# Patient Record
Sex: Female | Born: 1970 | Race: White | Hispanic: No | Marital: Married | Smoking: Never smoker
Health system: Southern US, Community
[De-identification: ages and names within clinical notes are randomized; demographics above are authoritative.]

## PROBLEM LIST (undated history)

## (undated) DIAGNOSIS — E039 Hypothyroidism, unspecified: Secondary | ICD-10-CM

## (undated) DIAGNOSIS — J309 Allergic rhinitis, unspecified: Secondary | ICD-10-CM

## (undated) DIAGNOSIS — M949 Disorder of cartilage, unspecified: Secondary | ICD-10-CM

## (undated) DIAGNOSIS — M899 Disorder of bone, unspecified: Secondary | ICD-10-CM

## (undated) DIAGNOSIS — C73 Malignant neoplasm of thyroid gland: Secondary | ICD-10-CM

## (undated) DIAGNOSIS — G4700 Insomnia, unspecified: Secondary | ICD-10-CM

## (undated) DIAGNOSIS — T8859XA Other complications of anesthesia, initial encounter: Secondary | ICD-10-CM

## (undated) DIAGNOSIS — F411 Generalized anxiety disorder: Secondary | ICD-10-CM

## (undated) DIAGNOSIS — H6010 Cellulitis of external ear, unspecified ear: Secondary | ICD-10-CM

## (undated) DIAGNOSIS — N6009 Solitary cyst of unspecified breast: Secondary | ICD-10-CM

## (undated) HISTORY — DX: Solitary cyst of unspecified breast: N60.09

## (undated) HISTORY — DX: Generalized anxiety disorder: F41.1

## (undated) HISTORY — DX: Disorder of bone, unspecified: M89.9

## (undated) HISTORY — DX: Hypothyroidism, unspecified: E03.9

## (undated) HISTORY — DX: Disorder of cartilage, unspecified: M94.9

## (undated) HISTORY — PX: BREAST SURGERY: SHX581

## (undated) HISTORY — DX: Allergic rhinitis, unspecified: J30.9

## (undated) HISTORY — DX: Insomnia, unspecified: G47.00

## (undated) HISTORY — DX: Cellulitis of external ear, unspecified ear: H60.10

## (undated) HISTORY — DX: Malignant neoplasm of thyroid gland: C73

---

## 1999-06-16 ENCOUNTER — Encounter: Payer: Self-pay | Admitting: Obstetrics and Gynecology

## 1999-06-16 ENCOUNTER — Ambulatory Visit (HOSPITAL_COMMUNITY): Admission: RE | Admit: 1999-06-16 | Discharge: 1999-06-16 | Payer: Self-pay | Admitting: Obstetrics and Gynecology

## 1999-06-29 ENCOUNTER — Ambulatory Visit (HOSPITAL_COMMUNITY): Admission: RE | Admit: 1999-06-29 | Discharge: 1999-06-29 | Payer: Self-pay | Admitting: Surgery

## 1999-06-29 ENCOUNTER — Encounter (INDEPENDENT_AMBULATORY_CARE_PROVIDER_SITE_OTHER): Payer: Self-pay

## 1999-06-29 ENCOUNTER — Encounter: Payer: Self-pay | Admitting: Surgery

## 1999-08-18 ENCOUNTER — Ambulatory Visit (HOSPITAL_BASED_OUTPATIENT_CLINIC_OR_DEPARTMENT_OTHER): Admission: RE | Admit: 1999-08-18 | Discharge: 1999-08-18 | Payer: Self-pay | Admitting: Surgery

## 1999-08-18 ENCOUNTER — Encounter (INDEPENDENT_AMBULATORY_CARE_PROVIDER_SITE_OTHER): Payer: Self-pay | Admitting: Specialist

## 2000-09-20 ENCOUNTER — Encounter: Admission: RE | Admit: 2000-09-20 | Discharge: 2000-09-20 | Payer: Self-pay | Admitting: Surgery

## 2000-09-20 ENCOUNTER — Encounter: Payer: Self-pay | Admitting: Surgery

## 2000-11-04 ENCOUNTER — Encounter: Payer: Self-pay | Admitting: Otolaryngology

## 2000-11-04 ENCOUNTER — Encounter: Admission: RE | Admit: 2000-11-04 | Discharge: 2000-11-04 | Payer: Self-pay | Admitting: Otolaryngology

## 2000-11-08 ENCOUNTER — Other Ambulatory Visit: Admission: RE | Admit: 2000-11-08 | Discharge: 2000-11-08 | Payer: Self-pay | Admitting: Otolaryngology

## 2000-11-19 ENCOUNTER — Other Ambulatory Visit: Admission: RE | Admit: 2000-11-19 | Discharge: 2000-11-19 | Payer: Self-pay | Admitting: Obstetrics and Gynecology

## 2001-01-30 ENCOUNTER — Ambulatory Visit (HOSPITAL_COMMUNITY): Admission: RE | Admit: 2001-01-30 | Discharge: 2001-01-31 | Payer: Self-pay | Admitting: Otolaryngology

## 2001-01-30 ENCOUNTER — Encounter (INDEPENDENT_AMBULATORY_CARE_PROVIDER_SITE_OTHER): Payer: Self-pay | Admitting: *Deleted

## 2001-11-25 ENCOUNTER — Other Ambulatory Visit: Admission: RE | Admit: 2001-11-25 | Discharge: 2001-11-25 | Payer: Self-pay | Admitting: Obstetrics and Gynecology

## 2002-02-17 ENCOUNTER — Encounter: Admission: RE | Admit: 2002-02-17 | Discharge: 2002-02-17 | Payer: Self-pay | Admitting: Obstetrics and Gynecology

## 2002-02-17 ENCOUNTER — Encounter: Payer: Self-pay | Admitting: Obstetrics and Gynecology

## 2002-06-04 DIAGNOSIS — C73 Malignant neoplasm of thyroid gland: Secondary | ICD-10-CM

## 2002-06-04 HISTORY — PX: THYROIDECTOMY: SHX17

## 2002-06-04 HISTORY — DX: Malignant neoplasm of thyroid gland: C73

## 2002-07-16 ENCOUNTER — Encounter (INDEPENDENT_AMBULATORY_CARE_PROVIDER_SITE_OTHER): Payer: Self-pay | Admitting: Specialist

## 2002-07-16 ENCOUNTER — Ambulatory Visit (HOSPITAL_COMMUNITY): Admission: RE | Admit: 2002-07-16 | Discharge: 2002-07-16 | Payer: Self-pay | Admitting: *Deleted

## 2002-09-17 ENCOUNTER — Ambulatory Visit (HOSPITAL_COMMUNITY): Admission: RE | Admit: 2002-09-17 | Discharge: 2002-09-17 | Payer: Self-pay | Admitting: Obstetrics and Gynecology

## 2002-09-17 ENCOUNTER — Encounter: Payer: Self-pay | Admitting: Obstetrics and Gynecology

## 2002-12-02 ENCOUNTER — Other Ambulatory Visit: Admission: RE | Admit: 2002-12-02 | Discharge: 2002-12-02 | Payer: Self-pay | Admitting: Obstetrics and Gynecology

## 2003-02-03 ENCOUNTER — Encounter: Admission: RE | Admit: 2003-02-03 | Discharge: 2003-02-03 | Payer: Self-pay | Admitting: Obstetrics and Gynecology

## 2003-02-03 ENCOUNTER — Encounter: Payer: Self-pay | Admitting: Obstetrics and Gynecology

## 2003-12-14 ENCOUNTER — Other Ambulatory Visit: Admission: RE | Admit: 2003-12-14 | Discharge: 2003-12-14 | Payer: Self-pay | Admitting: Obstetrics and Gynecology

## 2005-01-31 ENCOUNTER — Inpatient Hospital Stay (HOSPITAL_COMMUNITY): Admission: AD | Admit: 2005-01-31 | Discharge: 2005-02-03 | Payer: Self-pay | Admitting: Obstetrics and Gynecology

## 2005-02-27 ENCOUNTER — Encounter: Admission: RE | Admit: 2005-02-27 | Discharge: 2005-02-27 | Payer: Self-pay | Admitting: Obstetrics and Gynecology

## 2005-04-14 ENCOUNTER — Ambulatory Visit: Payer: Self-pay | Admitting: Family Medicine

## 2005-05-04 ENCOUNTER — Encounter: Admission: RE | Admit: 2005-05-04 | Discharge: 2005-05-04 | Payer: Self-pay | Admitting: Family Medicine

## 2005-05-04 ENCOUNTER — Ambulatory Visit: Payer: Self-pay | Admitting: Family Medicine

## 2005-05-29 ENCOUNTER — Ambulatory Visit: Payer: Self-pay | Admitting: Family Medicine

## 2005-07-23 ENCOUNTER — Encounter: Admission: RE | Admit: 2005-07-23 | Discharge: 2005-07-23 | Payer: Self-pay | Admitting: Obstetrics and Gynecology

## 2005-11-08 ENCOUNTER — Ambulatory Visit: Payer: Self-pay | Admitting: Family Medicine

## 2006-05-13 ENCOUNTER — Ambulatory Visit: Payer: Self-pay | Admitting: Family Medicine

## 2006-08-28 ENCOUNTER — Ambulatory Visit: Payer: Self-pay | Admitting: Internal Medicine

## 2006-09-18 ENCOUNTER — Ambulatory Visit: Payer: Self-pay | Admitting: Family Medicine

## 2006-12-30 DIAGNOSIS — T6391XA Toxic effect of contact with unspecified venomous animal, accidental (unintentional), initial encounter: Secondary | ICD-10-CM | POA: Insufficient documentation

## 2006-12-31 ENCOUNTER — Ambulatory Visit: Payer: Self-pay | Admitting: Family Medicine

## 2007-02-07 DIAGNOSIS — E039 Hypothyroidism, unspecified: Secondary | ICD-10-CM

## 2007-02-11 ENCOUNTER — Ambulatory Visit: Payer: Self-pay | Admitting: Family Medicine

## 2007-08-21 ENCOUNTER — Ambulatory Visit: Payer: Self-pay | Admitting: Family Medicine

## 2007-08-21 LAB — CONVERTED CEMR LAB
ALT: 21 units/L (ref 0–35)
AST: 23 units/L (ref 0–37)
Albumin: 3.9 g/dL (ref 3.5–5.2)
Alkaline Phosphatase: 43 units/L (ref 39–117)
BUN: 8 mg/dL (ref 6–23)
Basophils Absolute: 0 10*3/uL (ref 0.0–0.1)
Bilirubin, Direct: 0.1 mg/dL (ref 0.0–0.3)
Blood in Urine, dipstick: NEGATIVE
Calcium: 9.6 mg/dL (ref 8.4–10.5)
Chloride: 103 meq/L (ref 96–112)
Glucose, Urine, Semiquant: NEGATIVE
HCT: 41.8 % (ref 36.0–46.0)
Hemoglobin: 14.1 g/dL (ref 12.0–15.0)
Lymphocytes Relative: 23.5 % (ref 12.0–46.0)
MCHC: 33.7 g/dL (ref 30.0–36.0)
Monocytes Absolute: 0.1 10*3/uL — ABNORMAL LOW (ref 0.2–0.7)
Monocytes Relative: 2.1 % — ABNORMAL LOW (ref 3.0–11.0)
Neutro Abs: 5 10*3/uL (ref 1.4–7.7)
Neutrophils Relative %: 70.8 % (ref 43.0–77.0)
Potassium: 4.2 meq/L (ref 3.5–5.1)
Protein, U semiquant: NEGATIVE
RBC: 4.68 M/uL (ref 3.87–5.11)
RDW: 10.9 % — ABNORMAL LOW (ref 11.5–14.6)
Total CHOL/HDL Ratio: 3.6
Total Protein: 6.2 g/dL (ref 6.0–8.3)
Triglycerides: 145 mg/dL (ref 0–149)
Urobilinogen, UA: 0.2
WBC Urine, dipstick: NEGATIVE
pH: 5.5

## 2007-08-28 ENCOUNTER — Ambulatory Visit: Payer: Self-pay | Admitting: Family Medicine

## 2007-08-28 DIAGNOSIS — N6009 Solitary cyst of unspecified breast: Secondary | ICD-10-CM

## 2007-08-28 DIAGNOSIS — M8580 Other specified disorders of bone density and structure, unspecified site: Secondary | ICD-10-CM | POA: Insufficient documentation

## 2007-08-28 HISTORY — DX: Solitary cyst of unspecified breast: N60.09

## 2007-09-02 ENCOUNTER — Ambulatory Visit: Payer: Self-pay | Admitting: Internal Medicine

## 2007-09-02 ENCOUNTER — Encounter: Payer: Self-pay | Admitting: Family Medicine

## 2007-09-25 ENCOUNTER — Telehealth: Payer: Self-pay | Admitting: Family Medicine

## 2007-10-23 ENCOUNTER — Encounter: Admission: RE | Admit: 2007-10-23 | Discharge: 2007-10-23 | Payer: Self-pay | Admitting: Obstetrics and Gynecology

## 2007-11-14 ENCOUNTER — Ambulatory Visit: Payer: Self-pay | Admitting: Family Medicine

## 2007-11-14 LAB — CONVERTED CEMR LAB
Free T4: 0.56 ng/dL — ABNORMAL LOW (ref 0.89–1.80)
PTH: 11.1 pg/mL — ABNORMAL LOW (ref 14.0–72.0)

## 2007-11-15 ENCOUNTER — Telehealth: Payer: Self-pay | Admitting: Family Medicine

## 2007-11-17 ENCOUNTER — Telehealth: Payer: Self-pay | Admitting: *Deleted

## 2007-11-21 ENCOUNTER — Ambulatory Visit: Payer: Self-pay | Admitting: Family Medicine

## 2007-11-25 ENCOUNTER — Telehealth: Payer: Self-pay | Admitting: Family Medicine

## 2007-12-04 ENCOUNTER — Ambulatory Visit: Payer: Self-pay | Admitting: Family Medicine

## 2008-01-26 ENCOUNTER — Telehealth: Payer: Self-pay | Admitting: Family Medicine

## 2008-01-27 ENCOUNTER — Ambulatory Visit: Payer: Self-pay | Admitting: Internal Medicine

## 2008-01-27 DIAGNOSIS — T148XXA Other injury of unspecified body region, initial encounter: Secondary | ICD-10-CM | POA: Insufficient documentation

## 2008-04-17 ENCOUNTER — Ambulatory Visit: Payer: Self-pay | Admitting: Family Medicine

## 2008-04-28 ENCOUNTER — Telehealth: Payer: Self-pay | Admitting: Family Medicine

## 2008-07-21 ENCOUNTER — Encounter: Payer: Self-pay | Admitting: Family Medicine

## 2008-08-04 ENCOUNTER — Ambulatory Visit: Payer: Self-pay | Admitting: Family Medicine

## 2008-08-04 DIAGNOSIS — F4322 Adjustment disorder with anxiety: Secondary | ICD-10-CM

## 2008-08-09 ENCOUNTER — Ambulatory Visit: Payer: Self-pay | Admitting: Family Medicine

## 2008-08-09 DIAGNOSIS — R0602 Shortness of breath: Secondary | ICD-10-CM

## 2008-08-10 ENCOUNTER — Telehealth: Payer: Self-pay | Admitting: Family Medicine

## 2008-08-10 ENCOUNTER — Encounter: Payer: Self-pay | Admitting: Family Medicine

## 2008-08-11 ENCOUNTER — Emergency Department (HOSPITAL_COMMUNITY): Admission: EM | Admit: 2008-08-11 | Discharge: 2008-08-11 | Payer: Self-pay | Admitting: Emergency Medicine

## 2008-08-12 ENCOUNTER — Telehealth: Payer: Self-pay | Admitting: Speech Pathology

## 2008-08-20 ENCOUNTER — Ambulatory Visit: Payer: Self-pay | Admitting: Family Medicine

## 2008-08-26 ENCOUNTER — Encounter: Payer: Self-pay | Admitting: Internal Medicine

## 2008-09-10 ENCOUNTER — Telehealth: Payer: Self-pay | Admitting: Family Medicine

## 2008-09-10 ENCOUNTER — Ambulatory Visit: Payer: Self-pay | Admitting: Family Medicine

## 2008-09-23 ENCOUNTER — Ambulatory Visit: Payer: Self-pay | Admitting: Internal Medicine

## 2008-10-25 ENCOUNTER — Ambulatory Visit: Payer: Self-pay | Admitting: Internal Medicine

## 2009-01-28 ENCOUNTER — Ambulatory Visit: Payer: Self-pay | Admitting: Internal Medicine

## 2009-02-01 DIAGNOSIS — G4700 Insomnia, unspecified: Secondary | ICD-10-CM

## 2009-02-14 ENCOUNTER — Ambulatory Visit: Payer: Self-pay | Admitting: Family Medicine

## 2009-03-20 IMAGING — CR DG CHEST 2V
2 series · 2 of 2 positions shown · non-contrast
Comparison: No priors

CLINICAL DATA: Short of breath

CHEST - 2 VIEW

[w chest pa]
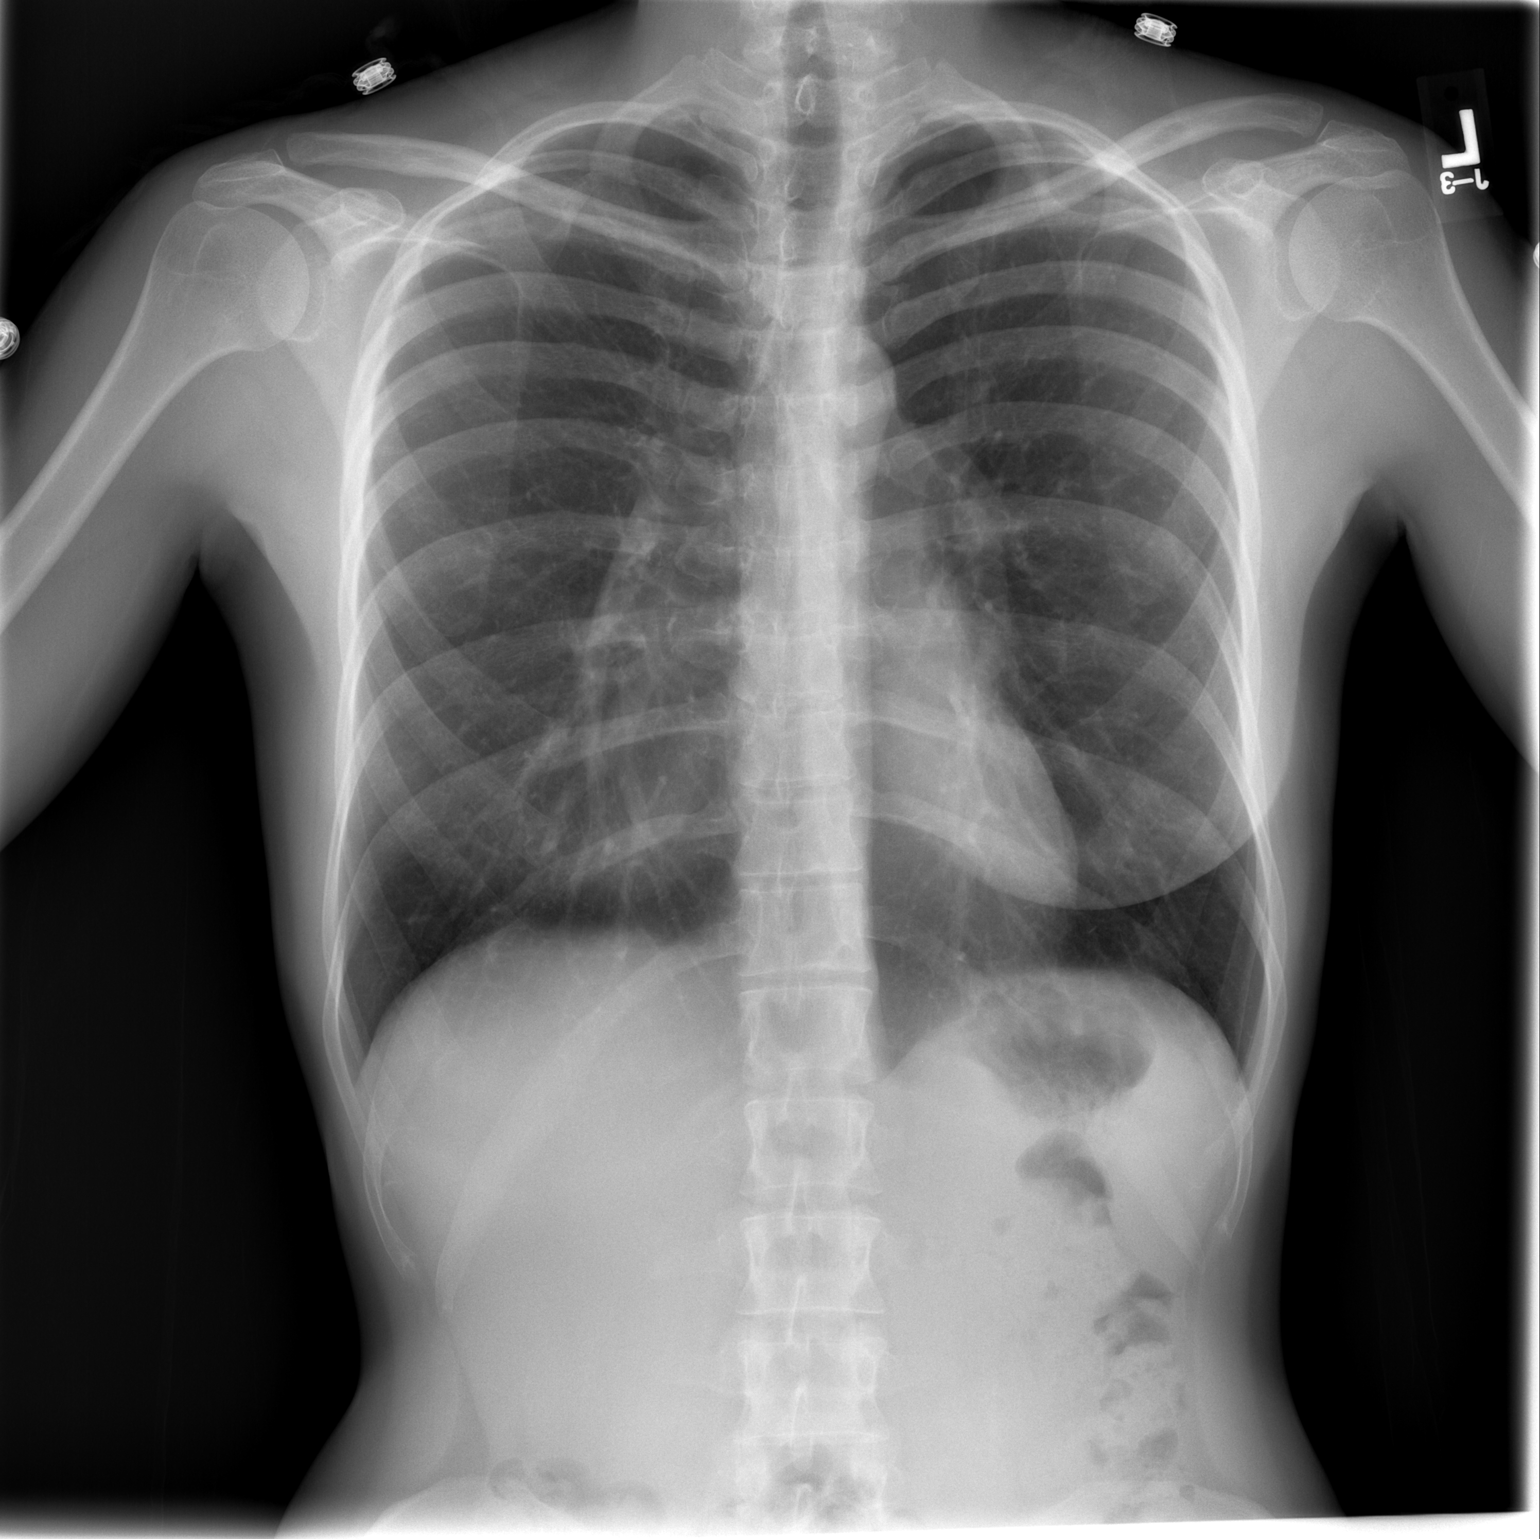

[w chest lat]
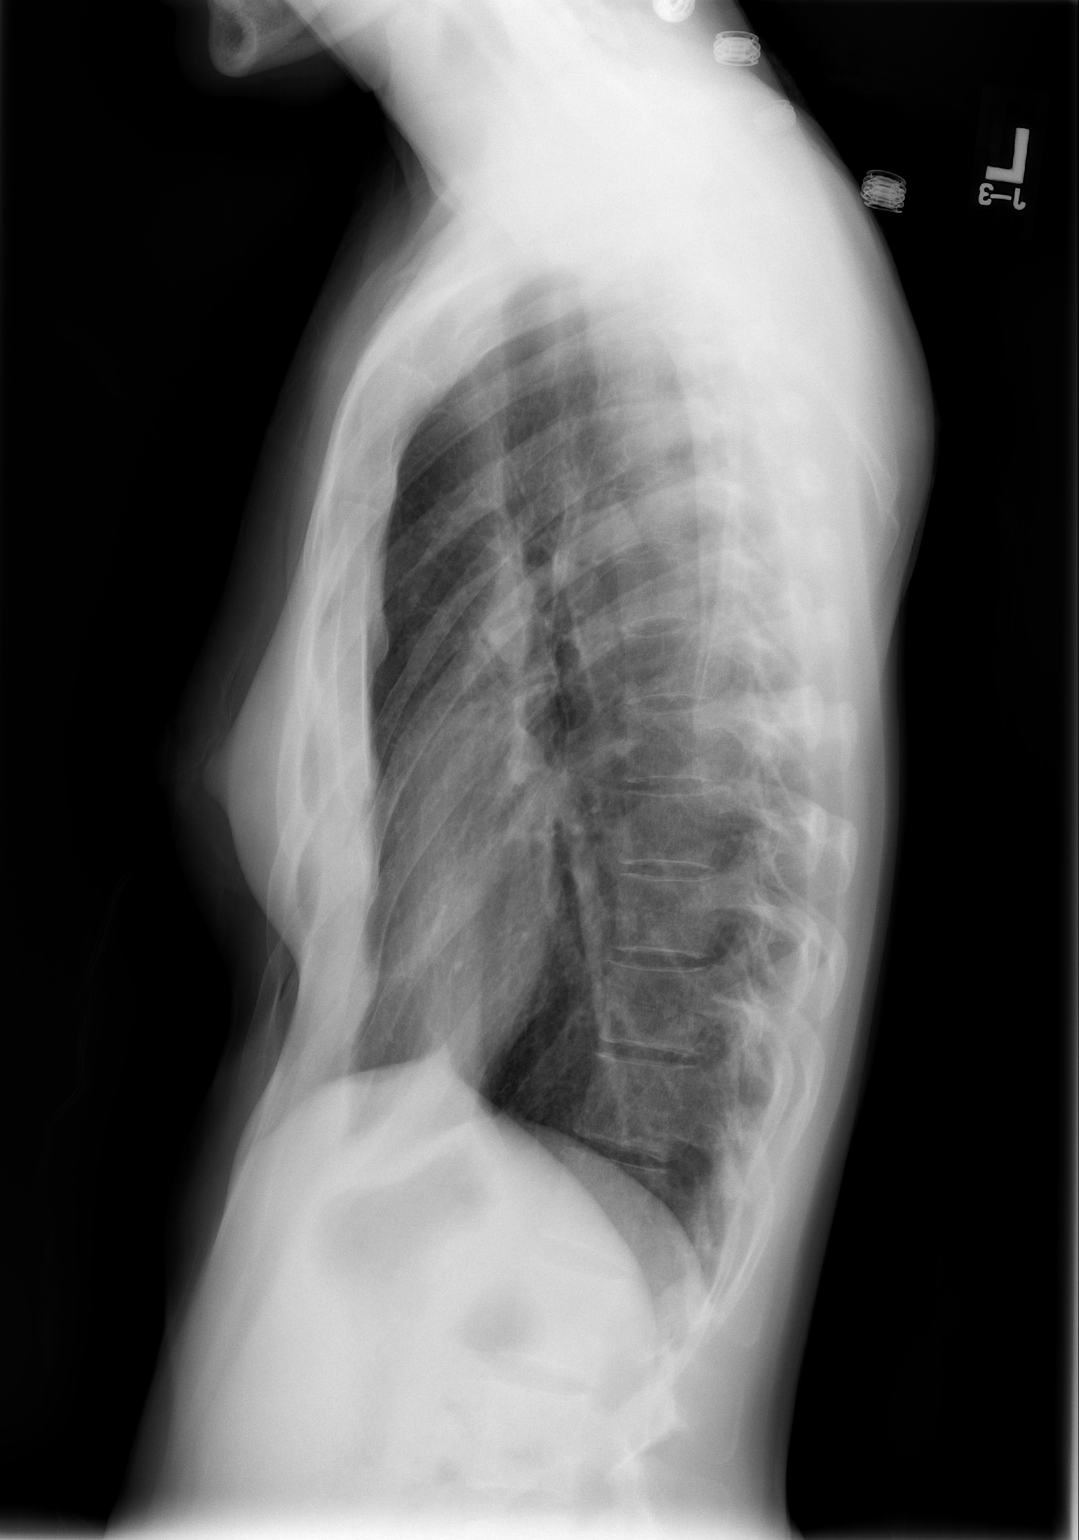

[2 of 2 positions shown; findings below may reference images not displayed]

FINDINGS: Small midline heart with obscuration of the right heart
border, due to a prominent pectus deformity.  The lungs are
hyperaerated but clear.  No pleural fluid.
IMPRESSION: 1.  Pectus deformity causing obscuration of the right heart border.
2.  Lungs hyperaerated.
3.  No active airspace disease.

## 2009-04-11 ENCOUNTER — Ambulatory Visit: Payer: Self-pay | Admitting: Family Medicine

## 2009-04-11 LAB — CONVERTED CEMR LAB
ALT: 16 units/L (ref 0–35)
BUN: 10 mg/dL (ref 6–23)
Basophils Relative: 0.6 % (ref 0.0–3.0)
Bilirubin Urine: NEGATIVE
Calcium: 9.3 mg/dL (ref 8.4–10.5)
Creatinine, Ser: 0.8 mg/dL (ref 0.4–1.2)
Eosinophils Absolute: 0.1 10*3/uL (ref 0.0–0.7)
Glucose, Urine, Semiquant: NEGATIVE
HDL: 43.1 mg/dL (ref >39.00)
Hemoglobin: 13.6 g/dL (ref 12.0–15.0)
Lymphocytes Relative: 18.5 % (ref 12.0–46.0)
MCV: 91.4 fL (ref 78.0–100.0)
Monocytes Relative: 5 % (ref 3.0–12.0)
Neutro Abs: 6.4 10*3/uL (ref 1.4–7.7)
Nitrite: NEGATIVE
RBC: 4.17 M/uL (ref 3.87–5.11)
RDW: 11.4 % — ABNORMAL LOW (ref 11.5–14.6)
Sodium: 141 meq/L (ref 135–145)
Specific Gravity, Urine: 1.025
TSH: 1.66 microintl units/mL (ref 0.35–5.50)
Total Bilirubin: 0.8 mg/dL (ref 0.3–1.2)
Total Protein: 7 g/dL (ref 6.0–8.3)
Urobilinogen, UA: 0.2
WBC: 8.6 10*3/uL (ref 4.5–10.5)
pH: 5

## 2009-04-18 ENCOUNTER — Ambulatory Visit: Payer: Self-pay | Admitting: Family Medicine

## 2009-07-18 ENCOUNTER — Encounter: Admission: RE | Admit: 2009-07-18 | Discharge: 2009-07-18 | Payer: Self-pay | Admitting: Obstetrics and Gynecology

## 2009-08-05 ENCOUNTER — Ambulatory Visit: Payer: Self-pay | Admitting: Family Medicine

## 2009-08-26 ENCOUNTER — Ambulatory Visit: Payer: Self-pay | Admitting: Internal Medicine

## 2009-09-22 ENCOUNTER — Ambulatory Visit: Payer: Self-pay | Admitting: Family Medicine

## 2009-11-18 ENCOUNTER — Ambulatory Visit: Payer: Self-pay | Admitting: Family Medicine

## 2010-01-11 ENCOUNTER — Ambulatory Visit: Payer: Self-pay | Admitting: Internal Medicine

## 2010-01-11 DIAGNOSIS — R209 Unspecified disturbances of skin sensation: Secondary | ICD-10-CM

## 2010-01-11 DIAGNOSIS — R5381 Other malaise: Secondary | ICD-10-CM

## 2010-01-11 DIAGNOSIS — F411 Generalized anxiety disorder: Secondary | ICD-10-CM

## 2010-01-11 DIAGNOSIS — R5383 Other fatigue: Secondary | ICD-10-CM

## 2010-01-11 DIAGNOSIS — Z85850 Personal history of malignant neoplasm of thyroid: Secondary | ICD-10-CM | POA: Insufficient documentation

## 2010-01-11 DIAGNOSIS — C73 Malignant neoplasm of thyroid gland: Secondary | ICD-10-CM

## 2010-01-12 LAB — CONVERTED CEMR LAB
AST: 21 units/L (ref 0–37)
Alkaline Phosphatase: 58 units/L (ref 39–117)
BUN: 10 mg/dL (ref 6–23)
Basophils Absolute: 0 10*3/uL (ref 0.0–0.1)
CO2: 32 meq/L (ref 19–32)
Calcium: 9.9 mg/dL (ref 8.4–10.5)
Chloride: 101 meq/L (ref 96–112)
Glucose, Bld: 89 mg/dL (ref 70–99)
HCT: 44.3 % (ref 36.0–46.0)
Lymphocytes Relative: 25.7 % (ref 12.0–46.0)
Lymphs Abs: 1.8 10*3/uL (ref 0.7–4.0)
MCHC: 35.1 g/dL (ref 30.0–36.0)
Neutrophils Relative %: 68.3 % (ref 43.0–77.0)
Platelets: 264 10*3/uL (ref 150.0–400.0)
Potassium: 3.9 meq/L (ref 3.5–5.1)
RDW: 12.3 % (ref 11.5–14.6)
Sed Rate: 13 mm/hr (ref 0–22)
Total Bilirubin: 0.4 mg/dL (ref 0.3–1.2)

## 2010-02-09 ENCOUNTER — Ambulatory Visit: Payer: Self-pay | Admitting: Internal Medicine

## 2010-02-13 ENCOUNTER — Ambulatory Visit: Payer: Self-pay | Admitting: Internal Medicine

## 2010-03-08 ENCOUNTER — Encounter: Payer: Self-pay | Admitting: Internal Medicine

## 2010-03-20 ENCOUNTER — Ambulatory Visit: Payer: Self-pay | Admitting: Internal Medicine

## 2010-03-22 ENCOUNTER — Telehealth (INDEPENDENT_AMBULATORY_CARE_PROVIDER_SITE_OTHER): Payer: Self-pay | Admitting: *Deleted

## 2010-03-28 ENCOUNTER — Encounter: Payer: Self-pay | Admitting: Internal Medicine

## 2010-04-04 ENCOUNTER — Encounter: Payer: Self-pay | Admitting: Internal Medicine

## 2010-04-12 ENCOUNTER — Telehealth: Payer: Self-pay | Admitting: Internal Medicine

## 2010-05-01 ENCOUNTER — Ambulatory Visit: Payer: Self-pay | Admitting: Internal Medicine

## 2010-05-16 ENCOUNTER — Encounter: Payer: Self-pay | Admitting: Internal Medicine

## 2010-05-16 ENCOUNTER — Encounter
Admission: RE | Admit: 2010-05-16 | Discharge: 2010-05-16 | Payer: Self-pay | Source: Home / Self Care | Attending: Surgery | Admitting: Surgery

## 2010-05-18 ENCOUNTER — Encounter: Payer: Self-pay | Admitting: Internal Medicine

## 2010-06-25 ENCOUNTER — Encounter: Payer: Self-pay | Admitting: Obstetrics and Gynecology

## 2010-07-02 LAB — CONVERTED CEMR LAB
Basophils Absolute: 0 10*3/uL (ref 0.0–0.1)
Basophils Relative: 0.4 % (ref 0.0–3.0)
Eosinophils Absolute: 0.2 10*3/uL (ref 0.0–0.7)
HCT: 40.6 % (ref 36.0–46.0)
Hemoglobin: 14.4 g/dL (ref 12.0–15.0)
MCHC: 35.4 g/dL (ref 30.0–36.0)
MCV: 88.5 fL (ref 78.0–100.0)
Monocytes Absolute: 0.4 10*3/uL (ref 0.1–1.0)
Monocytes Relative: 5.8 % (ref 3.0–12.0)
Platelets: 190 10*3/uL (ref 150–400)
RBC: 4.59 M/uL (ref 3.87–5.11)
RDW: 11.3 % — ABNORMAL LOW (ref 11.5–14.6)
T3, Free: 2.2 pg/mL — ABNORMAL LOW (ref 2.3–4.2)
TSH: 2.53 microintl units/mL (ref 0.35–5.50)
WBC: 6.5 10*3/uL (ref 4.5–10.5)

## 2010-07-04 NOTE — Assessment & Plan Note (Signed)
Summary: COUGH, CONGESTION, DRY EYES // RS   Vital Signs:  Patient profile:   40 year old female Weight:      93 pounds Temp:     98.0 degrees F BP sitting:   100 / 68  (left arm) Cuff size:   regular  Vitals Entered By: Duard Gwynne LPN (August 26, 2009 10:13 AM) CC: c/o increased cough and nasal drainage, seen 2 weeks ago , started refill on amox this past monday Is Patient Diabetic? No   Primary Care Provider:  Alonza Smoker  CC:  c/o increased cough and nasal drainage, seen 2 weeks ago , and started refill on amox this past monday.  History of Present Illness: 40 year old patient who is seen today for follow-up.  She was treated earlier with amoxicillin for acute sinusitis.  She initially improved, but then had relapse of her symptoms and has refilled the amoxicillin, 5 days ago.  She still has some right sinus less, but no pain and seems to be improving slightly.  There's been no fever.  No pain, headache, chills.  She still has and postnasal drip, mild cough, which is nonproductive.  Her main complaint is excessive dryness.  She is taking no antihistamines, and no decongestants.  she has a history of thyroid cancer on Synthroid supplementation  Allergies (verified): No Known Drug Allergies  Past History:  Past Medical History: Reviewed history from 09/23/2008 and no changes required. Hypothyroidism (s/p thyroidectomy Cancer) 2002 &2004, I 131 childbirth x 1, miscarriage x 1 lactose intolerant breast biopsy, right benign  Review of Systems       The patient complains of anorexia.  The patient denies fever, weight loss, weight gain, vision loss, decreased hearing, hoarseness, chest pain, syncope, dyspnea on exertion, peripheral edema, prolonged cough, headaches, hemoptysis, abdominal pain, melena, hematochezia, severe indigestion/heartburn, hematuria, incontinence, genital sores, muscle weakness, suspicious skin lesions, transient blindness, difficulty walking, depression,  unusual weight change, abnormal bleeding, enlarged lymph nodes, angioedema, and breast masses.    Physical Exam  General:  Well-developed,well-nourished,in no acute distress; alert,appropriate and cooperative throughout examination Head:  Normocephalic and atraumatic without obvious abnormalities. No apparent alopecia or balding. Eyes:  No corneal or conjunctival inflammation noted. EOMI. Perrla. Funduscopic exam benign, without hemorrhages, exudates or papilledema. Vision grossly normal. Ears:  External ear exam shows no significant lesions or deformities.  Otoscopic examination reveals clear canals, tympanic membranes are intact bilaterally without bulging, retraction, inflammation or discharge. Hearing is grossly normal bilaterally. Nose:  External nasal examination shows no deformity or inflammation. Nasal mucosa are pink and moist without lesions or exudates. Mouth:  Oral mucosa and oropharynx without lesions or exudates.  Teeth in good repair. Neck:  No deformities, masses, or tenderness noted. Lungs:  Normal respiratory effort, chest expands symmetrically. Lungs are clear to auscultation, no crackles or wheezes.   Impression & Recommendations:  Problem # 1:  SINUSITIS- ACUTE-NOS (ICD-461.9)  The following medications were removed from the medication list:    Amoxicillin 250 Mg Caps (Amoxicillin) .Marland Kitchen... Take 1 tablet by mouth three times a day Her updated medication list for this problem includes:    Amoxicillin-pot Clavulanate 500-125 Mg Tabs (Amoxicillin-pot clavulanate) ..... One twice daily  The following medications were removed from the medication list:    Amoxicillin 250 Mg Caps (Amoxicillin) .Marland Kitchen... Take 1 tablet by mouth three times a day Her updated medication list for this problem includes:    Amoxicillin-pot Clavulanate 500-125 Mg Tabs (Amoxicillin-pot clavulanate) ..... One twice daily  Problem #  2:  THYROID CANCER, HX OF (ICD-V10.87)  Complete Medication List: 1)   Calcium-vitamin D 600-200 Mg-unit Tabs (Calcium-vitamin d) .... Take 1 tablet by mouth two times a day 2)  Levoxyl 75 Mcg Tabs (Levothyroxine sodium) .... Take one tab once daily 3)  Thyrolar-1/4 15 (3.1-12.5) Mg (mcg) Tabs (Liotrix (t3-t4)) .... Take one tab once daily 4)  Genoptic 0.3 % Soln (Gentamicin sulfate) .Marland Kitchen.. 1 gtt r & l ey two times a day until clear 5)  Amoxicillin-pot Clavulanate 500-125 Mg Tabs (Amoxicillin-pot clavulanate) .... One twice daily  Patient Instructions: 1)  Take your antibiotic as prescribed until ALL of it is gone, but stop if you develop a rash or swelling and contact our office as soon as possible. 2)  Sudafed 30 mg 4 times daily 3)  MUCINEX one twice daily 4)  continue saline irrigation 5)  Afrin nasal spray twice daily for 3 days Prescriptions: AMOXICILLIN-POT CLAVULANATE 500-125 MG TABS (AMOXICILLIN-POT CLAVULANATE) one twice daily  #30 x 0   Entered and Authorized by:   Gordy Savers  MD   Signed by:   Gordy Savers  MD on 08/26/2009   Method used:   Print then Give to Patient   RxID:   [id number]
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
## 2010-07-04 NOTE — Progress Notes (Signed)
----   Converted from flag ---- ---- 03/21/2010 12:37 PM, Newt Lukes MD wrote: sorry - PNCS, no EMG  ---- 03/21/2010 12:24 PM, Dagoberto Reef wrote: Is this referral for neurology?  ---- 03/20/2010 3:05 PM, Newt Lukes MD wrote: The following orders have been entered for this patient and placed on Admin Hold:  Type:     Referral       Code:   Misc. Ref Description:   Misc. Referral Order Date:   03/20/2010   Authorized By:   Newt Lukes MD Order #:   [id number] Clinical Notes:   Type of Referral:B arm numbness and tingling - on/off symptoms x 4 months - r/o neuropathy Reason: ------------------------------

## 2010-07-04 NOTE — Consult Note (Signed)
Summary: Endocrine NP/[facility]  Endocrine NP/[facility]   Imported By: Sherian Rein 03/24/2010 11:21:20  _____________________________________________________________________  External Attachment:    Type:   Image     Comment:   External Document

## 2010-07-04 NOTE — Assessment & Plan Note (Signed)
Summary: sinuses//ccm   Vital Signs:  Patient profile:   40 year old female Weight:      92 pounds Temp:     98.5 degrees F oral BP sitting:   102 / 74  (left arm) Cuff size:   regular  Vitals Entered By: Kern Reap CMA Duncan Dull) (August 05, 2009 3:41 PM)  Reason for Visit sinus pressure  Primary Care Provider:  Alonza Smoker   History of Present Illness: Destiny Douglas is a 40 year old, married female, nonsmoker, G2, P2, attorney, who comes in with a two week history of a viral infection.  This evolved into bilateral frontal pain with bilateral bacterial conjunctivitis.  She had a viral infection starting two weeks ago.  For the past for 5 days.  She said frontal pain, constant, yellow discharge with some blood.  She also had bilateral conjunctivitis with discharge worse in the morning.  Vision normal.  No fever, earache, slight cough no sputum production, and no wheezing.  She is on day 25 of her cycle.  She is trying to get pregnant again  Allergies: No Known Drug Allergies  Past History:  Past medical, surgical, family and social histories (including risk factors) reviewed for relevance to current acute and chronic problems.  Past Medical History: Reviewed history from 09/23/2008 and no changes required. Hypothyroidism (s/p thyroidectomy Cancer) 2002 &2004, I 131 childbirth x 1, miscarriage x 1 lactose intolerant breast biopsy, right benign  Past Surgical History: Reviewed history from 02/07/2007 and no changes required. thyroid cancer surgery  Family History: Reviewed history from 08/28/2007 and no changes required. father has coronary disease had bypass surgery, hyperlipidemia, obesity, hypertension, and his smoker mother had a history of breast cancer postmenopausal no brothers one sister, hypothyroidism  Social History: Reviewed history from 09/23/2008 and no changes required. Occupation:Attorney mergers and transactions. originally from [place], [place] went to  undergraduate at [employer] and [employer] school Married Never Smoked Alcohol use-no Drug use-no Regular exercise-yes  Review of Systems      See HPI  Physical Exam  General:  Well-developed,well-nourished,in no acute distress; alert,appropriate and cooperative throughout examination Head:  Normocephalic and atraumatic without obvious abnormalities. No apparent alopecia or balding. Eyes:  red eyes bilaterally with matter Ears:  External ear exam shows no significant lesions or deformities.  Otoscopic examination reveals clear canals, tympanic membranes are intact bilaterally without bulging, retraction, inflammation or discharge. Hearing is grossly normal bilaterally. Nose:  External nasal examination shows no deformity or inflammation. Nasal mucosa are pink and moist without lesions or exudates. Mouth:  Oral mucosa and oropharynx without lesions or exudates.  Teeth in good repair. Neck:  No deformities, masses, or tenderness noted. Chest Wall:  No deformities, masses, or tenderness noted. Lungs:  Normal respiratory effort, chest expands symmetrically. Lungs are clear to auscultation, no crackles or wheezes.   Impression & Recommendations:  Problem # 1:  VIRAL URI (ICD-465.9) Assessment Deteriorated  Orders: Prescription Created Electronically [phone])  Problem # 2:  SINUSITIS- ACUTE-NOS (ICD-461.9) Assessment: Deteriorated  Her updated medication list for this problem includes:    Amoxicillin 250 Mg Caps (Amoxicillin) .Marland Kitchen... Take 1 tablet by mouth three times a day  Orders: Prescription Created Electronically [phone])  Complete Medication List: 1)  Calcium-vitamin D 600-200 Mg-unit Tabs (Calcium-vitamin d) .... Take 1 tablet by mouth two times a day 2)  Levoxyl 75 Mcg Tabs (Levothyroxine sodium) .... Take one tab once daily 3)  Thyrolar-1/4 15 (3.1-12.5) Mg (mcg) Tabs (Liotrix (t3-t4)) .... Take  one tab once daily 4)  Amoxicillin 250 Mg Caps (Amoxicillin) .... Take 1  tablet by mouth three times a day 5)  Genoptic 0.3 % Soln (Gentamicin sulfate) .Marland Kitchen.. 1 gtt r & l ey two times a day until clear  Patient Instructions: 1)  begin amoxicillin 250 mg 3 times a day x 10 days for the sinus infection. 2)  Irrigate twice a day with warm salt water. 3)  One drop in each eye twice a day for the conjunctivitis. 4)  Return p.r.n. Prescriptions: GENOPTIC 0.3 % SOLN (GENTAMICIN SULFATE) 1 gtt r & l ey two times a day until clear  #10 cc x 2   Entered and Authorized by:   Roderick Pee MD   Signed by:   Roderick Pee MD on 08/05/2009   Method used:   Electronically to        [facility] [address] [phone]* (retail)       [address]       [place], [place]  [address]       Ph: [phone] or [phone]       Fax: [phone]   RxID:   [id number] AMOXICILLIN 250 MG CAPS (AMOXICILLIN) Take 1 tablet by mouth three times a day  #30 x 1   Entered and Authorized by:   Roderick Pee MD   Signed by:   Roderick Pee MD on 08/05/2009   Method used:   Electronically to        [facility] [address] [phone]* (retail)       [address]       [place], [place]  [address]       Ph: [phone] or [phone]       Fax: [phone]   RxID:   [id number]
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
## 2010-07-04 NOTE — Assessment & Plan Note (Signed)
Summary: sinuses//ccm   Vital Signs:  Patient profile:   40 year old female Weight:      91 pounds Temp:     98.0 degrees F oral BP sitting:   102 / 64  (left arm) Cuff size:   regular  Vitals Entered By: Kern Reap CMA Duncan Dull) (September 22, 2009 10:58 AM) CC: sinus pressure   Primary Care Provider:  Alonza Smoker  CC:  sinus pressure.  History of Present Illness: Destiny Douglas is a 40 year old female, who comes in today for evaluation of head congestion.  We saw her a month ago and felt she had some mild sinusitis.  We started her on amoxicillin, which she took for 10 days.  It seemed to work, but then a week later, the discomfort came back.  She was then given a 10 day course of Augmentin.  This held but then the symptoms returned.  She wants to know if she should take a third course of antibiotics.  I discouraged this.  I think we need to send her for an allergy workup.  She is trying to get pregnant, so she is reluctant to have a diagnostic studies done of her sinuses i.e. CT scan, etc.  Allergies: No Known Drug Allergies  Past History:  Past medical, surgical, family and social histories (including risk factors) reviewed for relevance to current acute and chronic problems.  Past Medical History: Reviewed history from 09/23/2008 and no changes required. Hypothyroidism (s/p thyroidectomy Cancer) 2002 &2004, I 131 childbirth x 1, miscarriage x 1 lactose intolerant breast biopsy, right benign  Past Surgical History: Reviewed history from 02/07/2007 and no changes required. thyroid cancer surgery  Family History: Reviewed history from 08/28/2007 and no changes required. father has coronary disease had bypass surgery, hyperlipidemia, obesity, hypertension, and his smoker mother had a history of breast cancer postmenopausal no brothers one sister, hypothyroidism  Social History: Reviewed history from 09/23/2008 and no changes required. Occupation:Attorney mergers and  transactions. originally from [place], [place] went to undergraduate at [employer] and [employer] school Married Never Smoked Alcohol use-no Drug use-no Regular exercise-yes  Review of Systems      See HPI  Physical Exam  General:  Well-developed,well-nourished,in no acute distress; alert,appropriate and cooperative throughout examination Head:  Normocephalic and atraumatic without obvious abnormalities. No apparent alopecia or balding. Eyes:  No corneal or conjunctival inflammation noted. EOMI. Perrla. Funduscopic exam benign, without hemorrhages, exudates or papilledema. Vision grossly normal. Ears:  External ear exam shows no significant lesions or deformities.  Otoscopic examination reveals clear canals, tympanic membranes are intact bilaterally without bulging, retraction, inflammation or discharge. Hearing is grossly normal bilaterally. Nose:  External nasal examination shows no deformity or inflammation. Nasal mucosa are pink and moist without lesions or exudates. Mouth:  Oral mucosa and oropharynx without lesions or exudates.  Teeth in good repair. Neck:  No deformities, masses, or tenderness noted.   Problems:  Medical Problems Added: 1)  Dx of Rhinitis  (ICD-477.9)  Impression & Recommendations:  Problem # 1:  RHINITIS (ICD-477.9) Assessment Unchanged  Complete Medication List: 1)  Calcium-vitamin D 600-200 Mg-unit Tabs (Calcium-vitamin d) .... Take 1 tablet by mouth two times a day 2)  Levoxyl 75 Mcg Tabs (Levothyroxine sodium) .... Take one tab once daily 3)  Thyrolar-1/4 15 (3.1-12.5) Mg (mcg) Tabs (Liotrix (t3-t4)) .... Take one tab once daily 4)  Genoptic 0.3 % Soln (Gentamicin sulfate) .Marland Kitchen.. 1 gtt r & l ey two times a day until clear 5)  Amoxicillin-pot Clavulanate 500-125 Mg Tabs (Amoxicillin-pot clavulanate) .... One twice daily  Patient Instructions: 1)  call today and make an appointment to see Dr. Reather Converse. 2)  Prednisone one tablet x 3 days,  a half x 3 days, then half a tablet Monday, Wednesday, Friday, for a two week taper
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
## 2010-07-04 NOTE — Assessment & Plan Note (Signed)
Summary: FOOT INJURY // RS   Vital Signs:  Patient profile:   40 year old female Height:      58 inches Weight:      91 pounds BMI:     19.09 Temp:     98.1 degrees F oral BP sitting:   98 / 64  (left arm) Cuff size:   regular  Vitals Entered By: Kern Reap CMA Duncan Dull) (November 18, 2009 2:40 PM) CC: injury to toes on right foot   Primary Care Provider:  Alonza Smoker  CC:  injury to toes on right foot.  History of Present Illness: Destiny Douglas is a 40 year old female, who comes in today for evaluation of trauma to her right fifth toe.  This morning.  She kicked a door in the skin and part of the nail of right fifth toe tore off   Allergies: No Known Drug Allergies  Review of Systems      See HPI  Physical Exam  General:  Well-developed,well-nourished,in no acute distress; alert,appropriate and cooperative throughout examination Msk:  examination of the foot is normal.  There is a flap laceration with the skin and nail were partially torn.  The foot was soaked with peroxide.  A Steri-Strip was applied.  Dry sterile dressing was applied.  She was advised to keep it clean and dry remove the dressing in one week, sooner if any problems   Impression & Recommendations:  Problem # 1:  OPEN WOUND FT NO TOE ALONE WITHOUT MENTION COMP (ICD-892.0) Assessment New  Complete Medication List: 1)  Calcium-vitamin D 600-200 Mg-unit Tabs (Calcium-vitamin d) .... Take 1 tablet by mouth two times a day 2)  Levoxyl 75 Mcg Tabs (Levothyroxine sodium) .... Take one tab once daily 3)  Thyrolar-1/4 15 (3.1-12.5) Mg (mcg) Tabs (Liotrix (t3-t4)) .... Take one tab once daily 4)  Genoptic 0.3 % Soln (Gentamicin sulfate) .Marland Kitchen.. 1 gtt r & l ey two times a day until clear 5)  Amoxicillin-pot Clavulanate 500-125 Mg Tabs (Amoxicillin-pot clavulanate) .... One twice daily  Patient Instructions: 1)  elevate her foot today.  Keep it clean and dry.  Remove the dressing in one week, sooner if any  problems.   Immunization History:  Tetanus/Td Immunization History:    Tetanus/Td:  historical (06/04/2001)

## 2010-07-04 NOTE — Letter (Signed)
Summary: [facility]  [facility]   Imported By: Sherian Rein 03/31/2010 09:50:35  _____________________________________________________________________  External Attachment:    Type:   Image     Comment:   External Document
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
## 2010-07-04 NOTE — Progress Notes (Signed)
Summary: NCS results 04/04/10 - normal  Phone Note Outgoing Call   Summary of Call: please call pt- her nerve conduction results from 04/04/10 have been recieved and reviewed = normal study - no evidence for carpal tunnel or any other peripheral neuropathy. i do not have explaination for her arm tingling and numbness - we can discuss further at next OV as needed - thanks Initial call taken by: Newt Lukes MD,  April 12, 2010 9:20 AM  Follow-up for Phone Call        Pt informed and voiced understanding and states she will call to schedule ROV with VAL as needed. Follow-up by: Margaret Pyle, CMA,  April 12, 2010 10:24 AM

## 2010-07-06 NOTE — Letter (Signed)
Summary: [facility]   Imported By: Lester [place] 06/01/2010 11:00:45  _____________________________________________________________________  External Attachment:    Type:   Image     Comment:   External Document

## 2010-07-06 NOTE — Letter (Signed)
Summary: [facility]  [facility]   Imported By: Sherian Rein 05/31/2010 08:15:50  _____________________________________________________________________  External Attachment:    Type:   Image     Comment:   External Document

## 2010-07-25 ENCOUNTER — Encounter: Payer: Self-pay | Admitting: Internal Medicine

## 2010-08-01 NOTE — Letter (Signed)
Summary: Richrd Sox MD/[facility]  Richrd Sox MD/[facility]   Imported By: Lester [place] 07/28/2010 08:40:43  _____________________________________________________________________  External Attachment:    Type:   Image     Comment:   External Document

## 2010-08-04 ENCOUNTER — Other Ambulatory Visit: Payer: Self-pay | Admitting: Obstetrics and Gynecology

## 2010-08-04 DIAGNOSIS — Z1239 Encounter for other screening for malignant neoplasm of breast: Secondary | ICD-10-CM

## 2010-08-10 ENCOUNTER — Ambulatory Visit
Admission: RE | Admit: 2010-08-10 | Discharge: 2010-08-10 | Disposition: A | Discharge status: Home / Self Care | Payer: Commercial Indemnity | Source: Ambulatory Visit | Attending: Obstetrics and Gynecology | Admitting: Obstetrics and Gynecology

## 2010-08-10 DIAGNOSIS — Z1239 Encounter for other screening for malignant neoplasm of breast: Secondary | ICD-10-CM

## 2010-09-06 ENCOUNTER — Encounter: Payer: Self-pay | Admitting: Internal Medicine

## 2010-10-20 NOTE — Op Note (Signed)
[address] [place]  Patient:    Destiny Douglas, Destiny Douglas Visit Number: [id number] MRN: [id number]          Service Type: DSU Location: [id number] Attending Physician:  Susy Frizzle Proc. Date: 01/30/01 Admit Date:  01/30/2001   CC:         Zola Button T. Lazarus Salines, M.D.  Jethro Bastos, M.D.   Operative Report  PREOPERATIVE DIAGNOSIS:  Thyroid mass.  POSTOPERATIVE DIAGNOSIS:  Thyroid mass.  OPERATION PERFORMED:  Right hemithyroidectomy.  SURGEON:  Jefry H. Pollyann Kennedy, M.D.  ASSISTANT:  Gloris [place]. Lazarus Salines, M.D.  REFERRING PHYSICIAN:  Jethro Bastos, M.D.  ANESTHESIA:  General endotracheal.  COMPLICATIONS:  None.  OPERATIVE FINDINGS:  Approximately 1 cm firm mass in the right side of the isthmus of the thyroid gland with a 5 or 6 mm pretracheal node also present. 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  ESTIMATED BLOOD LOSS:  30 cc.  No other findings or abnormalities in the remainder of the cervical region or in the left lobe of the thyroid gland.  The patient tolerated the procedure well, was awakened, extubated and transferred to recovery in stable condition.  INDICATIONS FOR PROCEDURE:  The patient is a 40 year old with an incidentally identified thyroid nodule that was positive for atypical Hurthle cells on needle aspiration biopsy.  The risks, benefits, alternatives and complications of the procedure were explained to the patient who seemed to understand and agreed to surgery.  DESCRIPTION OF PROCEDURE:  The patient was taken to the operating room and placed on the operating table in supine position.  Following induction of general endotracheal anesthesia, the neck was prepped and draped in standard fashion and a shoulder roll was placed beneath the shoulder blades.  A transverse incision was outlined through a cervical skin crease at the level of the thyroid isthmus.  A 15 scalpel was used to  incised the skin and subcutaneous dissection was accomplished using electrocautery dissection. Subplatysmal flaps were developed superiorly and inferiorly giving adequate exposure.  Two small Weitlaner retractors were used to retract the skin flaps.  The midline fascia was divided and the thyroid isthmus was identified. Careful dissection laterally toward the right side reflecting the strap muscles laterally and staying adjacent to the capsule of the gland was accomplished.  An inferior parathyroid gland was identified and preserved with its blood supply.  The recurrent laryngeal nerve was identified and two branches were followed up just posterior to the capsule of the gland up until their entrance into the airway and were kept without injury.  A superior parathyroid was identified as well but was sacrificed with its blood supply. The ligament of Allyson Sabal was carefully divided and hemostasis was achieved.  The straps were reflected off of the medial aspect of the left lobe and the entire isthmus was contained within the specimen and dissection through the left side of the isthmus was accomplished.  A small pretracheal node was identified approximately 6 mm and was dissected out and sent with the specimen.  The wound was irrigated with saline.  Hemostasis was completed and the wound was closed in layers using 3-0 chromic on the strap muscle fascia and the platysma layer and running 5-0 nylon on the skin.  A #7 [place] round Jackson-Pratt drain was left in the wound exited through the left side of the incision and secured in place  with a nylon suture.  Bacitracin ointment as applied.  The patient was awakened,  extubated and transferred to recovery. Attending Physician:  Susy Frizzle DD:  01/30/01 TD:  01/30/01 Job: [id number]

## 2011-05-28 ENCOUNTER — Ambulatory Visit (INDEPENDENT_AMBULATORY_CARE_PROVIDER_SITE_OTHER): Payer: Commercial Indemnity | Admitting: Internal Medicine

## 2011-05-28 ENCOUNTER — Other Ambulatory Visit (INDEPENDENT_AMBULATORY_CARE_PROVIDER_SITE_OTHER): Payer: Commercial Indemnity

## 2011-05-28 ENCOUNTER — Encounter: Payer: Self-pay | Admitting: Internal Medicine

## 2011-05-28 VITALS — BP 98/70 | HR 88 | Temp 98.4°F | Ht <= 58 in | Wt 94.0 lb

## 2011-05-28 DIAGNOSIS — R200 Anesthesia of skin: Secondary | ICD-10-CM

## 2011-05-28 DIAGNOSIS — R209 Unspecified disturbances of skin sensation: Secondary | ICD-10-CM

## 2011-05-28 DIAGNOSIS — E039 Hypothyroidism, unspecified: Secondary | ICD-10-CM

## 2011-05-28 NOTE — Patient Instructions (Signed)
It was good to see you today. Test(s) ordered today. Your results will be called to you after review (48-72hours after test completion). If any changes need to be made, you will be notified at that time. Use Aleve 2x/day for 5 days, then as needed Work on back stretches and exercises as discussed - call us if symptoms worse or unimproved for other evaluation as needed
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
## 2011-05-28 NOTE — Assessment & Plan Note (Signed)
Post resection and ablation related to thyroid cancer 2004 Follows with endo regularly for same (at [facility]) Interval hx reviewed - currently doing well

## 2011-05-31 ENCOUNTER — Encounter: Payer: Self-pay | Admitting: Internal Medicine

## 2011-06-08 ENCOUNTER — Telehealth: Payer: Self-pay | Admitting: *Deleted

## 2011-06-08 DIAGNOSIS — Z0000 Encounter for general adult medical examination without abnormal findings: Secondary | ICD-10-CM

## 2011-06-08 NOTE — Telephone Encounter (Signed)
Message copied by Deatra James on Fri Jun 08, 2011  4:57 PM ------      Message from: COUSIN, [place] T      Created: Fri Jun 08, 2011  4:41 PM      Regarding: [facility] DATE  06/14/11        [name],  Please enter these phy labs.  Pt says she will come next Mon or Tue to have bloodwork done.--thanks

## 2011-06-12 ENCOUNTER — Other Ambulatory Visit (INDEPENDENT_AMBULATORY_CARE_PROVIDER_SITE_OTHER): Payer: Commercial Indemnity

## 2011-06-12 DIAGNOSIS — Z0000 Encounter for general adult medical examination without abnormal findings: Secondary | ICD-10-CM

## 2011-06-12 LAB — CBC WITH DIFFERENTIAL/PLATELET
Basophils Absolute: 0 10*3/uL (ref 0.0–0.1)
Basophils Relative: 0.3 % (ref 0.0–3.0)
Eosinophils Absolute: 0.1 10*3/uL (ref 0.0–0.7)
HCT: 40.6 % (ref 36.0–46.0)
Hemoglobin: 14.2 g/dL (ref 12.0–15.0)
Lymphocytes Relative: 23.4 % (ref 12.0–46.0)
Lymphs Abs: 1.7 10*3/uL (ref 0.7–4.0)
MCHC: 35 g/dL (ref 30.0–36.0)
MCV: 89.2 fl (ref 78.0–100.0)
Monocytes Absolute: 0.3 10*3/uL (ref 0.1–1.0)
Neutro Abs: 5 10*3/uL (ref 1.4–7.7)
RBC: 4.55 Mil/uL (ref 3.87–5.11)
RDW: 12 % (ref 11.5–14.6)

## 2011-06-12 LAB — BASIC METABOLIC PANEL
BUN: 10 mg/dL (ref 6–23)
Creatinine, Ser: 0.6 mg/dL (ref 0.4–1.2)
GFR: 109.21 mL/min (ref >60.00)
Glucose, Bld: 97 mg/dL (ref 70–99)
Potassium: 4.6 mEq/L (ref 3.5–5.1)

## 2011-06-12 LAB — URINALYSIS, ROUTINE W REFLEX MICROSCOPIC
Bilirubin Urine: NEGATIVE
Hgb urine dipstick: NEGATIVE
Ketones, ur: NEGATIVE
Leukocytes, UA: NEGATIVE
pH: 5.5 (ref 5.0–8.0)

## 2011-06-12 LAB — HEPATIC FUNCTION PANEL
Alkaline Phosphatase: 57 U/L (ref 39–117)
Bilirubin, Direct: 0.1 mg/dL (ref 0.0–0.3)
Total Bilirubin: 0.8 mg/dL (ref 0.3–1.2)

## 2011-06-12 LAB — LIPID PANEL
Cholesterol: 169 mg/dL (ref 0–200)
LDL Cholesterol: 91 mg/dL (ref 0–99)
Total CHOL/HDL Ratio: 3
VLDL: 17.4 mg/dL (ref 0.0–40.0)

## 2011-06-14 ENCOUNTER — Encounter: Payer: Commercial Indemnity | Admitting: Internal Medicine

## 2011-06-28 ENCOUNTER — Ambulatory Visit (INDEPENDENT_AMBULATORY_CARE_PROVIDER_SITE_OTHER): Payer: Commercial Indemnity | Admitting: Internal Medicine

## 2011-06-28 ENCOUNTER — Encounter: Payer: Self-pay | Admitting: Internal Medicine

## 2011-06-28 VITALS — BP 90/62 | HR 88 | Temp 96.8°F | Ht 58.5 in | Wt 96.8 lb

## 2011-06-28 DIAGNOSIS — E039 Hypothyroidism, unspecified: Secondary | ICD-10-CM

## 2011-06-28 DIAGNOSIS — L309 Dermatitis, unspecified: Secondary | ICD-10-CM

## 2011-06-28 DIAGNOSIS — Z23 Encounter for immunization: Secondary | ICD-10-CM

## 2011-06-28 DIAGNOSIS — L259 Unspecified contact dermatitis, unspecified cause: Secondary | ICD-10-CM

## 2011-06-28 DIAGNOSIS — Z0000 Encounter for general adult medical examination without abnormal findings: Secondary | ICD-10-CM

## 2011-06-28 MED ORDER — TRIAMCINOLONE ACETONIDE 0.1 % EX LOTN: TOPICAL_LOTION | Freq: Three times a day (TID) | CUTANEOUS | Status: DC
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
## 2011-06-28 NOTE — Assessment & Plan Note (Signed)
Post resection and ablation related to thyroid cancer 2004 Follows with endo regularly for same (at [facility]) Interval hx reviewed - currently doing well Suppressed TSH discussed -full TFTs not done and pt reports individual levels often normal with low TSH at endo f.u recommended follow up endo as planned, sooner if symptomatic or problems Lab Results  Component Value Date   TSH 0.04* 06/12/2011

## 2011-06-28 NOTE — Patient Instructions (Addendum)
It was good to see you today. Health Maintenance reviewed - Tdap today, everything else is up to date. We have reviewed your prior records including labs and tests today Discuss with your endocrinology specialist any concerns about your thyroid replacement Medications reviewed, use triamcinolone steroids for your eczema - no other changes at this time. Your prescription(s) have been submitted to your pharmacy. Please take as directed and contact our office if you believe you are having problem(s) with the medication(s). Please schedule followup annually for medical physical and labs, call sooner if problems.

## 2011-07-20 ENCOUNTER — Other Ambulatory Visit: Payer: Self-pay | Admitting: Obstetrics and Gynecology

## 2011-07-20 DIAGNOSIS — N6320 Unspecified lump in the left breast, unspecified quadrant: Secondary | ICD-10-CM

## 2011-07-20 DIAGNOSIS — N6310 Unspecified lump in the right breast, unspecified quadrant: Secondary | ICD-10-CM

## 2011-07-30 ENCOUNTER — Other Ambulatory Visit: Payer: Commercial Indemnity

## 2011-08-06 ENCOUNTER — Ambulatory Visit
Admission: RE | Admit: 2011-08-06 | Discharge: 2011-08-06 | Disposition: A | Discharge status: Home / Self Care | Payer: Commercial Indemnity | Source: Ambulatory Visit | Attending: Obstetrics and Gynecology | Admitting: Obstetrics and Gynecology

## 2011-08-06 ENCOUNTER — Other Ambulatory Visit: Payer: Self-pay | Admitting: Obstetrics and Gynecology

## 2011-08-06 DIAGNOSIS — N6320 Unspecified lump in the left breast, unspecified quadrant: Secondary | ICD-10-CM

## 2011-08-06 DIAGNOSIS — N6310 Unspecified lump in the right breast, unspecified quadrant: Secondary | ICD-10-CM

## 2011-12-05 ENCOUNTER — Ambulatory Visit (INDEPENDENT_AMBULATORY_CARE_PROVIDER_SITE_OTHER): Payer: Commercial Indemnity | Admitting: Internal Medicine

## 2011-12-05 ENCOUNTER — Encounter: Payer: Self-pay | Admitting: Internal Medicine

## 2011-12-05 VITALS — BP 90/60 | HR 75 | Temp 98.3°F | Ht 58.5 in | Wt 98.3 lb

## 2011-12-05 DIAGNOSIS — R209 Unspecified disturbances of skin sensation: Secondary | ICD-10-CM

## 2011-12-05 DIAGNOSIS — R200 Anesthesia of skin: Secondary | ICD-10-CM

## 2011-12-05 DIAGNOSIS — R5383 Other fatigue: Secondary | ICD-10-CM

## 2011-12-05 DIAGNOSIS — E039 Hypothyroidism, unspecified: Secondary | ICD-10-CM

## 2011-12-05 DIAGNOSIS — R5381 Other malaise: Secondary | ICD-10-CM

## 2011-12-05 NOTE — Progress Notes (Signed)
  Subjective:    Patient ID: Destiny Douglas, female    DOB: 11/05/1970, 41 y.o.   MRN: [id number]  HPI Here for constellation of symptoms : tingling and numbness in L hand (esp ulnar distribution), L>R calf pins and needles symptoms, foggy head, dry eyes and cracking voice  Intermittent and variable symptoms, wax and wane intensity and presence Ongoing > 6 mo  Past Medical History  Diagnosis Date  . THYROID CANCER 2004    s/p ablation  . HYPOTHYROIDISM   . Anxiety state, unspecified   . RHINITIS   . OSTEOPENIA   . INSOMNIA   . BREAST CYST, LEFT 08/28/2007  . HYPOTHYROIDISM     post ablation for malignancy    Review of Systems  Constitutional: Positive for fatigue. Negative for unexpected weight change.  Respiratory: Negative for cough and shortness of breath.   Neurological: Positive for numbness. Negative for facial asymmetry and headaches.  Psychiatric/Behavioral: Positive for decreased concentration. The patient is not nervous/anxious.        Objective:   Physical Exam BP 90/60  Pulse 75  Temp 98.3 F (36.8 C) (Oral)  Ht 4' 10.5" (1.486 m)  Wt 98 lb 4.8 oz (44.589 kg)  BMI 20.20 kg/m2  SpO2 99% Wt Readings from Last 3 Encounters:  12/05/11 98 lb 4.8 oz (44.589 kg)  06/28/11 96 lb 12.8 oz (43.908 kg)  05/28/11 94 lb 0.6 oz (42.656 kg)   Constitutional: She appears well-developed and well-nourished. No acute distress.  Musculoskeletal: No joint effusions. No gross deformities Neurological: She is alert and oriented to person, place, and time. No cranial nerve deficit. Coordination normal.  Psychiatric: She has an anxious mood and occ tearful affect. Her behavior is normal. Judgment and thought content normal.   Lab Results  Component Value Date   WBC 7.2 06/12/2011   HGB 14.2 06/12/2011   HCT 40.6 06/12/2011   PLT 235.0 06/12/2011   GLUCOSE 97 06/12/2011   CHOL 169 06/12/2011   TRIG 87.0 06/12/2011   HDL 61.10 06/12/2011   LDLCALC 91 06/12/2011   ALT 21 06/12/2011   AST 20  06/12/2011   NA 142 06/12/2011   K 4.6 06/12/2011   CL 105 06/12/2011   CREATININE 0.6 06/12/2011   BUN 10 06/12/2011   CO2 31 06/12/2011   TSH 0.04* 06/12/2011   Lab Results  Component Value Date   ESRSEDRATE 13 01/11/2010   Lab Results  Component Value Date   ANA NEG 01/11/2010       Assessment & Plan:  Nonspecific fatigue and numbness - prior labs and PNCS 04/04/2010 unremarkable Check MRI brain - r/o MS Also consider EMG and repeat labs if no hormone imbalance identified on upcoming eval with endo and gyn (historically, pt very sensitive to these thyroid and menstrual cycle fluctuations with similar symptom presentation)

## 2011-12-05 NOTE — Patient Instructions (Signed)
It was good to see you today. we'll make referral to MRI brain to exclude multiple sclerosis as cause for symptoms. Our office will contact you regarding appointment(s) once made. continue to work with endo and gyno as ongoing Please schedule followup in 2-3 months, call sooner if problems.

## 2011-12-06 NOTE — Assessment & Plan Note (Signed)
Post resection and ablation related to thyroid cancer 2004 Follows with endo regularly for same (at [facility]) Interval hx reviewed - Suppressed TSH here 06/2011 discussed -full TFTs not done and pt reports individual levels often normal with low TSH at endo follow up  recommended follow up endo as planned Lab Results  Component Value Date   TSH 0.04* 06/12/2011

## 2012-02-08 ENCOUNTER — Inpatient Hospital Stay: Admission: RE | Admit: 2012-02-08 | Payer: Commercial Indemnity | Source: Ambulatory Visit

## 2012-06-07 ENCOUNTER — Ambulatory Visit (INDEPENDENT_AMBULATORY_CARE_PROVIDER_SITE_OTHER): Payer: BC Managed Care – PPO | Admitting: Family Medicine

## 2012-06-07 ENCOUNTER — Encounter: Payer: Self-pay | Admitting: Family Medicine

## 2012-06-07 VITALS — BP 100/64 | HR 106 | Temp 98.1°F | Wt 97.0 lb

## 2012-06-07 DIAGNOSIS — J329 Chronic sinusitis, unspecified: Secondary | ICD-10-CM

## 2012-06-07 MED ORDER — AMOXICILLIN-POT CLAVULANATE 875-125 MG PO TABS: 1.0000 | ORAL_TABLET | Freq: Two times a day (BID) | ORAL | Status: DC

## 2012-06-07 MED ORDER — FLUTICASONE PROPIONATE 50 MCG/ACT NA SUSP: NASAL | Status: DC

## 2012-06-07 NOTE — Patient Instructions (Addendum)

## 2012-06-07 NOTE — Progress Notes (Signed)
  Subjective:     Destiny Douglas is a 42 y.o. female who presents for evaluation of sinus pain. Symptoms include: congestion, cough, facial pain, headaches, nasal congestion, post nasal drip, sinus pressure and ear pressure. Onset of symptoms was 2 weeks ago. Symptoms have been gradually worsening since that time. Past history is significant for no history of pneumonia or bronchitis. Patient is a non-smoker.  The following portions of the patient's history were reviewed and updated as appropriate: allergies, current medications, past family history, past medical history, past social history, past surgical history and problem list.  Review of Systems Pertinent items are noted in HPI.   Objective:    BP 100/64  Pulse 106  Temp 98.1 F (36.7 C) (Oral)  Wt 97 lb (43.999 kg)  SpO2 97% General appearance: alert, cooperative, appears stated age and mild distress Ears: normal TM's and external ear canals both ears Nose: green discharge, moderate congestion, turbinates red, swollen, sinus tenderness bilateral Throat: abnormal findings: exudates present and mild oropharyngeal erythema Neck: mild anterior cervical adenopathy, supple, symmetrical, trachea midline and thyroid not enlarged, symmetric, no tenderness/mass/nodules Lungs: clear to auscultation bilaterally Heart: S1, S2 normal    Assessment:    Acute bacterial sinusitis.    Plan:    Nasal steroids per medication orders. Antihistamines per medication orders. Augmentin per medication orders.

## 2012-06-10 ENCOUNTER — Ambulatory Visit: Payer: Commercial Indemnity | Admitting: Internal Medicine

## 2012-09-19 ENCOUNTER — Ambulatory Visit (INDEPENDENT_AMBULATORY_CARE_PROVIDER_SITE_OTHER): Payer: BC Managed Care – PPO | Admitting: Sports Medicine

## 2012-09-19 VITALS — BP 100/60 | Ht 59.0 in | Wt 95.0 lb

## 2012-09-19 DIAGNOSIS — M25561 Pain in right knee: Secondary | ICD-10-CM | POA: Insufficient documentation

## 2012-09-19 DIAGNOSIS — M25569 Pain in unspecified knee: Secondary | ICD-10-CM

## 2012-09-19 NOTE — Patient Instructions (Signed)
Thank you for coming in today. We think you have a meniscus injury.  Try the compression sleeve Try the K-tape.  3 ibuprofen every 8 hours of 1-2 aleve twice a day as needed.   Strength exercises.  1) Toe out straight leg raises 2) Ball between the knees knee extension.   3) Side leg raises 4) Standing hip rotation  Return in 6 weeks as needed.

## 2012-09-19 NOTE — Progress Notes (Signed)
Destiny Douglas is a 42 y.o. female who presents to [facility] today for right medial knee pain for one month. Patient had significant medial knee pain without injury. This is improving the last several weeks with K-tape. She denies any locking or catching or swelling. She denies any radiating pain weakness or numbness. She's tried some ibuprofen which has helped as well. She denies any history of similar injury.   PMH reviewed. Thyroid cancer status post excision with thyroid supplementation History  Substance Use Topics  . Smoking status: Never Smoker   . Smokeless tobacco: Not on file     Comment: Married, lives with husband and their son. 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 Alcohol Use: No   ROS as above otherwise neg   Exam:  BP 100/60  Ht 4\' 11"  (1.499 m)  Wt 95 lb (43.092 kg)  BMI 19.18 kg/m2 Gen: Well NAD MSK: Right knee. Normal-appearing no effusion or synovitis Range of motion 0-130 1+ retropatellar crepitations on extension Mildly tender to palpation of the anterior medial joint line Nontender laterally Positive medial McMurray's test Negative valgus varus and Lachman's  Hip abduction strength: 4/5 bilaterally Leg length: Equal bilaterally  Limited musculoskeletal ultrasound of the right knee: Intact quad tendon with mild effusion Normal-appearing patellar tendon Lateral meniscus is normal-appearing Longitudinal hypoechoic change within the anterior medial meniscus characteristic of degenerative tear.

## 2012-09-19 NOTE — Assessment & Plan Note (Signed)
Suspicious for medial meniscus tear Patient is improving Plan: Compressive knee sleeve do to effusion Quad and hip abductor strength exercises Followup 6 weeks or as needed

## 2012-10-04 ENCOUNTER — Encounter: Payer: Self-pay | Admitting: Family Medicine

## 2012-10-04 ENCOUNTER — Ambulatory Visit (INDEPENDENT_AMBULATORY_CARE_PROVIDER_SITE_OTHER): Payer: BC Managed Care – PPO | Admitting: Family Medicine

## 2012-10-04 VITALS — BP 98/64 | Temp 98.4°F | Wt 97.0 lb

## 2012-10-04 DIAGNOSIS — B349 Viral infection, unspecified: Secondary | ICD-10-CM

## 2012-10-04 DIAGNOSIS — B9789 Other viral agents as the cause of diseases classified elsewhere: Secondary | ICD-10-CM

## 2012-10-04 DIAGNOSIS — J45909 Unspecified asthma, uncomplicated: Secondary | ICD-10-CM

## 2012-10-04 MED ORDER — HYDROCODONE-HOMATROPINE 5-1.5 MG/5ML PO SYRP: 5.0000 mL | ORAL_SOLUTION | Freq: Three times a day (TID) | ORAL | Status: DC | PRN

## 2012-10-04 MED ORDER — PREDNISONE 20 MG PO TABS: ORAL_TABLET | ORAL | Status: DC

## 2012-10-04 NOTE — Progress Notes (Signed)
  Subjective:    Patient ID: Destiny Douglas, female    DOB: 04/27/1971, 42 y.o.   MRN: [id number]  HPI Destiny Douglas is a 42 year old female married nonsmoker who comes in today with a two-week history of coughing that seems to be getting worse the last couple days. She has not no history of pulmonary disease. No history of allergic rhinitis or asthma. She states that her son went to a pediatrician with the same symptoms and was given an antibiotic. I cannot find any evidence of a bacterial infection that would indicate the need for an antibiotic in her  Review of Systems    pulmonary review of systems otherwise negative Objective:   Physical Exam Well-developed well-nourished female no acute distress HEENT negative neck was supple no adenopathy lungs are clear except for symmetrical late expiratory wheezing       Assessment & Plan:  Viral treat symptomatically with prednisone Hydromet for cough and cold syndrome with secondary asthma plan

## 2012-10-04 NOTE — Patient Instructions (Addendum)
Take prednisone 20 mg,,,,,,,,, 2 tabs x3 days or until you feel a lot better then taper as outlined  Drink lots of water in the  Hydromet 1/2-1 teaspoon 3 times daily when necessary for cough and cold return when necessary

## 2012-10-08 ENCOUNTER — Ambulatory Visit: Payer: BC Managed Care – PPO | Admitting: Sports Medicine

## 2012-10-29 ENCOUNTER — Ambulatory Visit: Payer: BC Managed Care – PPO | Admitting: Sports Medicine

## 2012-11-24 ENCOUNTER — Ambulatory Visit (INDEPENDENT_AMBULATORY_CARE_PROVIDER_SITE_OTHER): Payer: BC Managed Care – PPO | Admitting: Internal Medicine

## 2012-11-24 ENCOUNTER — Other Ambulatory Visit (INDEPENDENT_AMBULATORY_CARE_PROVIDER_SITE_OTHER): Payer: BC Managed Care – PPO

## 2012-11-24 ENCOUNTER — Encounter: Payer: Self-pay | Admitting: Internal Medicine

## 2012-11-24 VITALS — BP 100/72 | HR 67 | Temp 97.3°F | Wt 98.4 lb

## 2012-11-24 DIAGNOSIS — R209 Unspecified disturbances of skin sensation: Secondary | ICD-10-CM

## 2012-11-24 DIAGNOSIS — R200 Anesthesia of skin: Secondary | ICD-10-CM

## 2012-11-24 DIAGNOSIS — R202 Paresthesia of skin: Secondary | ICD-10-CM

## 2012-11-24 DIAGNOSIS — E039 Hypothyroidism, unspecified: Secondary | ICD-10-CM

## 2012-11-24 LAB — HEPATIC FUNCTION PANEL
ALT: 19 U/L (ref 0–35)
Alkaline Phosphatase: 51 U/L (ref 39–117)
Bilirubin, Direct: 0.1 mg/dL (ref 0.0–0.3)
Total Protein: 7.2 g/dL (ref 6.0–8.3)

## 2012-11-24 LAB — CBC WITH DIFFERENTIAL/PLATELET
Basophils Absolute: 0 10*3/uL (ref 0.0–0.1)
Eosinophils Absolute: 0.2 10*3/uL (ref 0.0–0.7)
Lymphocytes Relative: 21.4 % (ref 12.0–46.0)
MCHC: 34.6 g/dL (ref 30.0–36.0)
Neutrophils Relative %: 70.1 % (ref 43.0–77.0)
Platelets: 241 10*3/uL (ref 150.0–400.0)
RDW: 12.1 % (ref 11.5–14.6)

## 2012-11-24 LAB — BASIC METABOLIC PANEL
CO2: 30 mEq/L (ref 19–32)
Chloride: 103 mEq/L (ref 96–112)
Creatinine, Ser: 0.7 mg/dL (ref 0.4–1.2)
Sodium: 138 mEq/L (ref 135–145)

## 2012-11-24 LAB — VITAMIN B12: Vitamin B-12: 849 pg/mL (ref 211–911)

## 2012-11-24 NOTE — Patient Instructions (Signed)
It was good to see you today. We have reviewed your prior records including labs and tests today Medications reviewed and updated, no changes recommended at this time. Test(s) ordered today. Your results will be released to MyChart (or called to you) after review, usually within 72hours after test completion. If any changes need to be made, you will be notified at that same time. If labs normal, follow up with endocrine about thyroid med doses - If still numbness persisting with "normal hormones", let us know and we will order MRI brain as discussed -

## 2012-11-24 NOTE — Progress Notes (Signed)
  Subjective:    Patient ID: Destiny Douglas, female    DOB: 09-14-1970, 42 y.o.   MRN: [id number]  HPI  complains of numbness in her legs, L>R Onset <2 weeks Associated with transient color change of distal extremities ( dark bluish gray, then pallor, then pinkish) No radiation of pain but episodic symptoms also present in left arm and lower back/spine Similar to prior symptoms as when "hormones were off balance" - thyroid and ovarian Reviewed recent medication changes in effort to wean off T3 therapy  Past Medical History  Diagnosis Date  . THYROID CANCER 2004    s/p ablation  . HYPOTHYROIDISM   . Anxiety state, unspecified   . RHINITIS   . OSTEOPENIA   . INSOMNIA   . BREAST CYST, LEFT 08/28/2007  . HYPOTHYROIDISM     post ablation for malignancy    Review of Systems  Constitutional: Positive for fatigue. Negative for unexpected weight change.  Eyes: Negative for pain and visual disturbance.       "twitching" spasms  Respiratory: Negative for cough and shortness of breath.   Cardiovascular: Negative for leg swelling.  Neurological: Positive for numbness. Negative for tremors, seizures and weakness.       Objective:   Physical Exam BP 100/72  Pulse 67  Temp(Src) 97.3 F (36.3 C) (Oral)  Wt 98 lb 6.4 oz (44.634 kg)  BMI 19.86 kg/m2  SpO2 99% Wt Readings from Last 3 Encounters:  11/24/12 98 lb 6.4 oz (44.634 kg)  10/04/12 97 lb (43.999 kg)  09/19/12 95 lb (43.092 kg)   Constitutional: She is petite, but appears well-developed and well-nourished. No distress.  Neck: Normal range of motion. Neck supple. No JVD present. No thyromegaly present.  Cardiovascular: Normal rate, regular rhythm and normal heart sounds.  No murmur heard. No BLE edema. Good DP bilaterally Pulmonary/Chest: Effort normal and breath sounds normal. No respiratory distress. She has no wheezes.  Musculoskeletal: Normal range of motion, no joint effusions. No gross deformities Neurological: She is  alert and oriented to person, place, and time. No cranial nerve deficit. Coordination, balance, strength, speech and gait are normal.  Skin: Skin is warm and dry. No rash noted. No erythema.  Psychiatric: She has a normal mood and affect. Her behavior is normal. Judgment and thought content normal.   Lab Results  Component Value Date   WBC 7.2 06/12/2011   HGB 14.2 06/12/2011   HCT 40.6 06/12/2011   PLT 235.0 06/12/2011   GLUCOSE 97 06/12/2011   CHOL 169 06/12/2011   TRIG 87.0 06/12/2011   HDL 61.10 06/12/2011   LDLCALC 91 06/12/2011   ALT 21 06/12/2011   AST 20 06/12/2011   NA 142 06/12/2011   K 4.6 06/12/2011   CL 105 06/12/2011   CREATININE 0.6 06/12/2011   BUN 10 06/12/2011   CO2 31 06/12/2011   TSH 0.04* 06/12/2011      Assessment & Plan:   Numbness, BLE, L>R - also L arm and trunk Check labs  Prior nerve conduction study of the upper extremity performed for similar symptoms November 2011 reviewed: normal MRI brain if labs normal and persisting symptoms

## 2012-11-24 NOTE — Assessment & Plan Note (Signed)
Post resection and ablation related to thyroid cancer 2004 Follows with endo regularly for same (at [facility]) - working to wean off T3 and solo tx with T4 at this time -  previously very sensitive to hormone dose changes - ?numbness related to same Interval hx reviewed - recommended follow up endo as planned  Lab Results  Component Value Date   TSH 0.04* 06/12/2011

## 2012-12-16 ENCOUNTER — Encounter: Payer: Self-pay | Admitting: Internal Medicine

## 2012-12-16 ENCOUNTER — Ambulatory Visit (INDEPENDENT_AMBULATORY_CARE_PROVIDER_SITE_OTHER): Payer: Self-pay | Admitting: Internal Medicine

## 2012-12-16 VITALS — BP 110/62 | HR 82 | Temp 97.4°F | Wt 97.8 lb

## 2012-12-16 DIAGNOSIS — Z85850 Personal history of malignant neoplasm of thyroid: Secondary | ICD-10-CM

## 2012-12-16 DIAGNOSIS — R221 Localized swelling, mass and lump, neck: Secondary | ICD-10-CM

## 2012-12-16 DIAGNOSIS — R220 Localized swelling, mass and lump, head: Secondary | ICD-10-CM

## 2012-12-16 NOTE — Progress Notes (Signed)
  Subjective:    Patient ID: Destiny Douglas, female    DOB: [birth year], 42 y.o.   MRN: [id number]  HPI  See CC - lump in neck, posterior left of mid line 1st noted 8 days ago in associated with mosiquito bite - Itch and bte symptoms have resolved and "lump" persistis Since then, no change on size -  Minimally associated with tenderness to palpation, but not associated with redness, hoarseness, or URI symptoms  Past Medical History  Diagnosis Date  . THYROID CANCER 2004    s/p ablation  . Anxiety state, unspecified   . RHINITIS   . OSTEOPENIA   . INSOMNIA   . BREAST CYST, LEFT 08/28/2007  . HYPOTHYROIDISM     post ablation for malignancy    Review of Systems  Constitutional: Positive for fatigue. Negative for fever and unexpected weight change.  HENT: Negative for ear pain, sore throat, facial swelling, sneezing, neck pain and ear discharge.   Eyes: Negative for pain and visual disturbance.  Respiratory: Negative for cough and shortness of breath.   Cardiovascular: Negative for chest pain and leg swelling.       Objective:   Physical Exam  BP 110/62  Pulse 82  Temp(Src) 97.4 F (36.3 C) (Oral)  Wt 97 lb 12.8 oz (44.362 kg)  BMI 19.74 kg/m2  SpO2 99% Wt Readings from Last 3 Encounters:  12/16/12 97 lb 12.8 oz (44.362 kg)  11/24/12 98 lb 6.4 oz (44.634 kg)  10/04/12 97 lb (43.999 kg)   Constitutional: She is petite, but appears well-developed and well-nourished. No distress.  Neck: posterior neck, left of mid line with freely mobile 4mm nodule, rubbery-like vs cystic. Normal range of motion. Neck supple. No JVD present. No thyromegaly present.  Cardiovascular: Normal rate, regular rhythm and normal heart sounds.  No murmur heard. No BLE edema. Good DP bilaterally Pulmonary/Chest: Effort normal and breath sounds normal. No respiratory distress. She has no wheezes.  Skin: Skin is warm and dry. No rash noted. No erythema.  Psychiatric: She has a normal mood and affect.  Her behavior is normal. Judgment and thought content normal.   Lab Results  Component Value Date   WBC 8.9 11/24/2012   HGB 14.5 11/24/2012   HCT 41.9 11/24/2012   PLT 241.0 11/24/2012   GLUCOSE 86 11/24/2012   CHOL 169 06/12/2011   TRIG 87.0 06/12/2011   HDL 61.10 06/12/2011   LDLCALC 91 06/12/2011   ALT 19 11/24/2012   AST 17 11/24/2012   NA 138 11/24/2012   K 4.1 11/24/2012   CL 103 11/24/2012   CREATININE 0.7 11/24/2012   BUN 11 11/24/2012   CO2 30 11/24/2012   TSH 0.04* 06/12/2011   HGBA1C 4.8 11/24/2012      Assessment & Plan:   Posterior neck mass - suspect LN reaction to recent bug bite However, given thyroid cancer history, will check ultrasound to evaluate

## 2012-12-16 NOTE — Patient Instructions (Signed)
It was good to see you today. we'll make referral for soft tissue ultrasound . Our office will contact you regarding appointment(s) once made.  Your results will be released to MyChart (or called to you) after review, usually within 72hours after test completion. If any changes need to be made, you will be notified at that same time.

## 2012-12-18 ENCOUNTER — Ambulatory Visit
Admission: RE | Admit: 2012-12-18 | Discharge: 2012-12-18 | Disposition: A | Discharge status: Home / Self Care | Payer: BC Managed Care – PPO | Source: Ambulatory Visit | Attending: Internal Medicine | Admitting: Internal Medicine

## 2012-12-18 DIAGNOSIS — R221 Localized swelling, mass and lump, neck: Secondary | ICD-10-CM

## 2012-12-18 DIAGNOSIS — Z85850 Personal history of malignant neoplasm of thyroid: Secondary | ICD-10-CM

## 2013-01-08 ENCOUNTER — Telehealth: Payer: Self-pay | Admitting: *Deleted

## 2013-01-08 DIAGNOSIS — Z0000 Encounter for general adult medical examination without abnormal findings: Secondary | ICD-10-CM

## 2013-01-08 NOTE — Telephone Encounter (Signed)
Message copied by Deatra James on Thu Jan 08, 2013  4:21 PM ------      Message from: Burnett Harry      Created: Thu Jan 08, 2013  2:47 PM      Regarding: CPE 02/27/13       THANKS! ------

## 2013-02-25 ENCOUNTER — Other Ambulatory Visit (INDEPENDENT_AMBULATORY_CARE_PROVIDER_SITE_OTHER): Payer: BC Managed Care – PPO

## 2013-02-25 DIAGNOSIS — Z0000 Encounter for general adult medical examination without abnormal findings: Secondary | ICD-10-CM

## 2013-02-25 LAB — BASIC METABOLIC PANEL
GFR: 93.03 mL/min (ref >60.00)
Potassium: 4.1 mEq/L (ref 3.5–5.1)
Sodium: 138 mEq/L (ref 135–145)

## 2013-02-25 LAB — CBC WITH DIFFERENTIAL/PLATELET
Eosinophils Relative: 3.5 % (ref 0.0–5.0)
HCT: 41.8 % (ref 36.0–46.0)
Hemoglobin: 14.7 g/dL (ref 12.0–15.0)
Lymphs Abs: 1.9 10*3/uL (ref 0.7–4.0)
Monocytes Relative: 5.7 % (ref 3.0–12.0)
Neutro Abs: 3.9 10*3/uL (ref 1.4–7.7)
WBC: 6.4 10*3/uL (ref 4.5–10.5)

## 2013-02-25 LAB — LIPID PANEL
LDL Cholesterol: 99 mg/dL (ref 0–99)
VLDL: 19.6 mg/dL (ref 0.0–40.0)

## 2013-02-25 LAB — HEPATIC FUNCTION PANEL
ALT: 20 U/L (ref 0–35)
AST: 22 U/L (ref 0–37)
Bilirubin, Direct: 0 mg/dL (ref 0.0–0.3)
Total Bilirubin: 0.8 mg/dL (ref 0.3–1.2)
Total Protein: 7.4 g/dL (ref 6.0–8.3)

## 2013-02-25 LAB — URINALYSIS, ROUTINE W REFLEX MICROSCOPIC
Ketones, ur: NEGATIVE
Leukocytes, UA: NEGATIVE
Nitrite: NEGATIVE
Specific Gravity, Urine: >=1.03 (ref 1.000–1.030)
Urine Glucose: NEGATIVE
pH: 5.5 (ref 5.0–8.0)

## 2013-02-25 LAB — TSH: TSH: 0.13 u[IU]/mL — ABNORMAL LOW (ref 0.35–5.50)

## 2013-03-02 ENCOUNTER — Ambulatory Visit (INDEPENDENT_AMBULATORY_CARE_PROVIDER_SITE_OTHER): Payer: BC Managed Care – PPO | Admitting: Internal Medicine

## 2013-03-02 ENCOUNTER — Encounter: Payer: Self-pay | Admitting: Internal Medicine

## 2013-03-02 VITALS — BP 102/74 | HR 73 | Temp 98.3°F | Ht 59.0 in | Wt 98.4 lb

## 2013-03-02 DIAGNOSIS — M25569 Pain in unspecified knee: Secondary | ICD-10-CM

## 2013-03-02 DIAGNOSIS — R209 Unspecified disturbances of skin sensation: Secondary | ICD-10-CM

## 2013-03-02 DIAGNOSIS — R200 Anesthesia of skin: Secondary | ICD-10-CM

## 2013-03-02 DIAGNOSIS — Z0000 Encounter for general adult medical examination without abnormal findings: Secondary | ICD-10-CM

## 2013-03-02 DIAGNOSIS — Z1239 Encounter for other screening for malignant neoplasm of breast: Secondary | ICD-10-CM

## 2013-03-02 DIAGNOSIS — M25561 Pain in right knee: Secondary | ICD-10-CM

## 2013-03-02 DIAGNOSIS — E039 Hypothyroidism, unspecified: Secondary | ICD-10-CM

## 2013-03-02 NOTE — Progress Notes (Signed)
Subjective:    Patient ID: Destiny Douglas, female    DOB: 1971-01-28, 42 y.o.   MRN: [id number]  HPI patient is here today for annual physical. Patient feels well overall  Also reviewed chronic medical issues and interval medical events  Past Medical History  Diagnosis Date  . THYROID CANCER 2004    s/p ablation  . Anxiety state, unspecified   . RHINITIS   . OSTEOPENIA   . INSOMNIA   . BREAST CYST, LEFT 08/28/2007  . HYPOTHYROIDISM     post ablation for malignancy   Family History  Problem Relation Age of Onset  . Breast cancer Mother   . Coronary artery disease Father   . Hyperlipidemia Father   . Hypertension Father   . Hypothyroidism Sister    History  Substance Use Topics  . Smoking status: Never Smoker   . Smokeless tobacco: Not on file     Comment: Married, lives with husband and their son. 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 Alcohol Use: No    Review of Systems Constitutional: Negative for fever or weight change.  Respiratory: Negative for cough and shortness of breath.   Cardiovascular: Negative for chest pain or palpitations.  Gastrointestinal: Negative for abdominal pain, no bowel changes.  Musculoskeletal: Negative for gait problem or joint swelling.  Skin: Negative for rash.  Neurological: Negative for dizziness or headache.  No other specific complaints in a complete review of systems (except as listed in HPI above).     Objective:   Physical Exam BP 102/74  Pulse 73  Temp(Src) 98.3 F (36.8 C) (Oral)  Ht 4\' 11"  (1.499 m)  Wt 98 lb 6.4 oz (44.634 kg)  BMI 19.86 kg/m2  SpO2 98% Wt Readings from Last 3 Encounters:  03/02/13 98 lb 6.4 oz (44.634 kg)  12/16/12 97 lb 12.8 oz (44.362 kg)  11/24/12 98 lb 6.4 oz (44.634 kg)   Constitutional: She is thin, petite - appears well-developed and well-nourished. No distress.  HENT: Head: Normocephalic and atraumatic. Ears: B TMs ok, no erythema or  effusion; Nose: Nose normal.  Mouth/Throat: Oropharynx is clear and moist. No oropharyngeal exudate.  Eyes: Conjunctivae and EOM are normal. Pupils are equal, round, and reactive to light. No scleral icterus.  Neck: Normal range of motion. Neck supple. No JVD present. No thyromegaly present.  Cardiovascular: Normal rate, regular rhythm and normal heart sounds.  No murmur heard. No BLE edema. Pulmonary/Chest: Effort normal and breath sounds normal. No respiratory distress. She has no wheezes. Abdominal: Soft. Bowel sounds are normal. She exhibits no distension. There is no tenderness. no masses  Musculoskeletal: FROM, no effusions or gross deformities  Skin: no rash, ulceration or erythema Psychiatric: She has a normal mood and mildly anxious affect. Her behavior is normal. Judgment and thought content normal.  Neurological: She is alert and oriented to person, place, and time. No cranial nerve deficit. Coordination normal.    Lab Results  Component Value Date   WBC 6.4 02/25/2013   HGB 14.7 02/25/2013   HCT 41.8 02/25/2013   PLT 236.0 02/25/2013   GLUCOSE 85 02/25/2013   CHOL 180 02/25/2013   TRIG 98.0 02/25/2013   HDL 61.70 02/25/2013   LDLCALC 99 02/25/2013   ALT 20 02/25/2013   AST 22 02/25/2013   NA 138 02/25/2013   K 4.1 02/25/2013   CL 104 02/25/2013   CREATININE 0.7 02/25/2013   BUN 10 02/25/2013   CO2  29 02/25/2013   TSH 0.13* 02/25/2013   HGBA1C 4.8 11/24/2012   Lab Results  Component Value Date   VITAMINB12 849 11/24/2012        Assessment & Plan:  CPX - v70.0 - Patient has been counseled on age-appropriate routine health concerns for screening and prevention. These are reviewed and up-to-date. Immunizations are up-to-date or declined. Labs reviewed.  Numbness, BLE, L>R - also L arm and trunk  Intermittent and non-progressing chronic symptoms Prior nerve conduction study of the upper extremity performed for similar symptoms November 2011 reviewed: normal  Consider MRI brain if  worsening symptoms -not indicated at this time

## 2013-03-02 NOTE — Assessment & Plan Note (Signed)
Post resection and ablation related to thyroid cancer 2004 Follows with endo regularly for same (at [facility]) - working to wean T3 to lowest possible dose and ongoing T4 previously very sensitive to hormone dose changes - ?numbness related to same Interval hx reviewed - recommended follow up endo as planned  Lab Results  Component Value Date   TSH 0.13* 02/25/2013

## 2013-03-02 NOTE — Assessment & Plan Note (Signed)
eval by sports med 09/2012 reviewed -?medial meniscus tear on [place] Reviewed remote injury age 42 during college years to same knee, possible relation to same Patient to followup with sports medicine as planned consider steroid injection if continued daily symptoms unrelieved by oral anti-inflammatory

## 2013-03-02 NOTE — Patient Instructions (Addendum)
It was good to see you today. Health Maintenance reviewed - refer for mammogram screening, everything else is up to date. We have reviewed your prior records including labs and tests today Continue to work with your endocrinology specialist about your thyroid replacement Medications reviewed and updated - no changes at this time. Continue followup with Dr. Darrick Penna for your knee pain symptoms as discussed, consider steroid injection if needed for pain Please schedule followup annually for medical physical and labs, call sooner if problems.  Health Maintenance, Females A healthy lifestyle and preventative care can promote health and wellness.  Maintain regular health, dental, and eye exams.  Eat a healthy diet. Foods like vegetables, fruits, whole grains, low-fat dairy products, and lean protein foods contain the nutrients you need without too many calories. Decrease your intake of foods high in solid fats, added sugars, and salt. Get information about a proper diet from your caregiver, if necessary.  Regular physical exercise is one of the most important things you can do for your health. Most adults should get at least 150 minutes of moderate-intensity exercise (any activity that increases your heart rate and causes you to sweat) each week. In addition, most adults need muscle-strengthening exercises on 2 or more days a week.   Maintain a healthy weight. The body mass index (BMI) is a screening tool to identify possible weight problems. It provides an estimate of body fat based on height and weight. Your caregiver can help determine your BMI, and can help you achieve or maintain a healthy weight. For adults 20 years and older:  A BMI below 18.5 is considered underweight.  A BMI of 18.5 to 24.9 is normal.  A BMI of 25 to 29.9 is considered overweight.  A BMI of 30 and above is considered obese.  Maintain normal blood lipids and cholesterol by exercising and minimizing your intake of  saturated fat. Eat a balanced diet with plenty of fruits and vegetables. Blood tests for lipids and cholesterol should begin at age 37 and be repeated every 5 years. If your lipid or cholesterol levels are high, you are over 50, or you are a high risk for heart disease, you may need your cholesterol levels checked more frequently.Ongoing high lipid and cholesterol levels should be treated with medicines if diet and exercise are not effective.  If you smoke, find out from your caregiver how to quit. If you do not use tobacco, do not start.  If you are pregnant, do not drink alcohol. If you are breastfeeding, be very cautious about drinking alcohol. If you are not pregnant and choose to drink alcohol, do not exceed 1 drink per day. One drink is considered to be 12 ounces (355 mL) of beer, 5 ounces (148 mL) of wine, or 1.5 ounces (44 mL) of liquor.  Avoid use of street drugs. Do not share needles with anyone. Ask for help if you need support or instructions about stopping the use of drugs.  High blood pressure causes heart disease and increases the risk of stroke. Blood pressure should be checked at least every 1 to 2 years. Ongoing high blood pressure should be treated with medicines, if weight loss and exercise are not effective.  If you are 31 to 42 years old, ask your caregiver if you should take aspirin to prevent strokes.  Diabetes screening involves taking a blood sample to check your fasting blood sugar level. This should be done once every 3 years, after age 45, if you are within normal  weight and without risk factors for diabetes. Testing should be considered at a younger age or be carried out more frequently if you are overweight and have at least 1 risk factor for diabetes.  Breast cancer screening is essential preventative care for women. You should practice "breast self-awareness." This means understanding the normal appearance and feel of your breasts and may include breast  self-examination. Any changes detected, no matter how small, should be reported to a caregiver. Women in their 85s and 30s should have a clinical breast exam (CBE) by a caregiver as part of a regular health exam every 1 to 3 years. After age 24, women should have a CBE every year. Starting at age 70, women should consider having a mammogram (breast X-ray) every year. Women who have a family history of breast cancer should talk to their caregiver about genetic screening. Women at a high risk of breast cancer should talk to their caregiver about having an MRI and a mammogram every year.  The Pap test is a screening test for cervical cancer. Women should have a Pap test starting at age 57. Between ages 79 and 77, Pap tests should be repeated every 2 years. Beginning at age 20, you should have a Pap test every 3 years as long as the past 3 Pap tests have been normal. If you had a hysterectomy for a problem that was not cancer or a condition that could lead to cancer, then you no longer need Pap tests. If you are between ages 66 and 35, and you have had normal Pap tests going back 10 years, you no longer need Pap tests. If you have had past treatment for cervical cancer or a condition that could lead to cancer, you need Pap tests and screening for cancer for at least 20 years after your treatment. If Pap tests have been discontinued, risk factors (such as a new sexual partner) need to be reassessed to determine if screening should be resumed. Some women have medical problems that increase the chance of getting cervical cancer. In these cases, your caregiver may recommend more frequent screening and Pap tests.  The human papillomavirus (HPV) test is an additional test that may be used for cervical cancer screening. The HPV test looks for the virus that can cause the cell changes on the cervix. The cells collected during the Pap test can be tested for HPV. The HPV test could be used to screen women aged 42 years and  older, and should be used in women of any age who have unclear Pap test results. After the age of 59, women should have HPV testing at the same frequency as a Pap test.  Colorectal cancer can be detected and often prevented. Most routine colorectal cancer screening begins at the age of 68 and continues through age 52. However, your caregiver may recommend screening at an earlier age if you have risk factors for colon cancer. On a yearly basis, your caregiver may provide home test kits to check for hidden blood in the stool. Use of a small camera at the end of a tube, to directly examine the colon (sigmoidoscopy or colonoscopy), can detect the earliest forms of colorectal cancer. Talk to your caregiver about this at age 44, when routine screening begins. Direct examination of the colon should be repeated every 5 to 10 years through age 55, unless early forms of pre-cancerous polyps or small growths are found.  Hepatitis C blood testing is recommended for all people born from [birth year]  through 1965 and any individual with known risks for hepatitis C.  Practice safe sex. Use condoms and avoid high-risk sexual practices to reduce the spread of sexually transmitted infections (STIs). Sexually active women aged 43 and younger should be checked for Chlamydia, which is a common sexually transmitted infection. Older women with new or multiple partners should also be tested for Chlamydia. Testing for other STIs is recommended if you are sexually active and at increased risk.  Osteoporosis is a disease in which the bones lose minerals and strength with aging. This can result in serious bone fractures. The risk of osteoporosis can be identified using a bone density scan. Women ages 50 and over and women at risk for fractures or osteoporosis should discuss screening with their caregivers. Ask your caregiver whether you should be taking a calcium supplement or vitamin D to reduce the rate of osteoporosis.  Menopause can be  associated with physical symptoms and risks. Hormone replacement therapy is available to decrease symptoms and risks. You should talk to your caregiver about whether hormone replacement therapy is right for you.  Use sunscreen with a sun protection factor (SPF) of 30 or greater. Apply sunscreen liberally and repeatedly throughout the day. You should seek shade when your shadow is shorter than you. Protect yourself by wearing long sleeves, pants, a wide-brimmed hat, and sunglasses year round, whenever you are outdoors.  Notify your caregiver of new moles or changes in moles, especially if there is a change in shape or color. Also notify your caregiver if a mole is larger than the size of a pencil eraser.  Stay current with your immunizations. Document Released: 12/04/2010 Document Revised: 08/13/2011 Document Reviewed: 12/04/2010 [facility] Patient Information 2014 [place], [place].

## 2013-04-15 ENCOUNTER — Ambulatory Visit
Admission: RE | Admit: 2013-04-15 | Discharge: 2013-04-15 | Disposition: A | Discharge status: Home / Self Care | Payer: BC Managed Care – PPO | Source: Ambulatory Visit | Attending: Internal Medicine | Admitting: Internal Medicine

## 2013-04-15 DIAGNOSIS — Z1239 Encounter for other screening for malignant neoplasm of breast: Secondary | ICD-10-CM

## 2013-06-12 ENCOUNTER — Other Ambulatory Visit (INDEPENDENT_AMBULATORY_CARE_PROVIDER_SITE_OTHER): Payer: BC Managed Care – PPO

## 2013-06-12 ENCOUNTER — Ambulatory Visit (INDEPENDENT_AMBULATORY_CARE_PROVIDER_SITE_OTHER): Payer: BC Managed Care – PPO | Admitting: Internal Medicine

## 2013-06-12 ENCOUNTER — Encounter: Payer: Self-pay | Admitting: Internal Medicine

## 2013-06-12 VITALS — BP 100/72 | HR 92 | Temp 98.0°F | Wt 96.8 lb

## 2013-06-12 DIAGNOSIS — N912 Amenorrhea, unspecified: Secondary | ICD-10-CM

## 2013-06-12 DIAGNOSIS — E039 Hypothyroidism, unspecified: Secondary | ICD-10-CM

## 2013-06-12 DIAGNOSIS — N951 Menopausal and female climacteric states: Secondary | ICD-10-CM

## 2013-06-12 LAB — BASIC METABOLIC PANEL
BUN: 12 mg/dL (ref 6–23)
CO2: 29 mEq/L (ref 19–32)
Calcium: 9.5 mg/dL (ref 8.4–10.5)
Chloride: 105 mEq/L (ref 96–112)
Creatinine, Ser: 0.8 mg/dL (ref 0.4–1.2)
GFR: 87.36 mL/min (ref >60.00)
Glucose, Bld: 89 mg/dL (ref 70–99)
POTASSIUM: 3.9 meq/L (ref 3.5–5.1)
SODIUM: 139 meq/L (ref 135–145)

## 2013-06-12 LAB — LUTEINIZING HORMONE: LH: 21.25 m[IU]/mL

## 2013-06-12 LAB — CBC WITH DIFFERENTIAL/PLATELET
BASOS PCT: 0.4 % (ref 0.0–3.0)
Basophils Absolute: 0 10*3/uL (ref 0.0–0.1)
Eosinophils Absolute: 0.1 10*3/uL (ref 0.0–0.7)
Eosinophils Relative: 2.1 % (ref 0.0–5.0)
HEMATOCRIT: 43 % (ref 36.0–46.0)
HEMOGLOBIN: 14.7 g/dL (ref 12.0–15.0)
Lymphocytes Relative: 23.5 % (ref 12.0–46.0)
Lymphs Abs: 1.7 10*3/uL (ref 0.7–4.0)
MCHC: 34.2 g/dL (ref 30.0–36.0)
MCV: 89 fl (ref 78.0–100.0)
MONO ABS: 0.3 10*3/uL (ref 0.1–1.0)
Monocytes Relative: 4.7 % (ref 3.0–12.0)
NEUTROS ABS: 5 10*3/uL (ref 1.4–7.7)
Neutrophils Relative %: 69.3 % (ref 43.0–77.0)
Platelets: 249 10*3/uL (ref 150.0–400.0)
RBC: 4.83 Mil/uL (ref 3.87–5.11)
RDW: 12 % (ref 11.5–14.6)
WBC: 7.2 10*3/uL (ref 4.5–10.5)

## 2013-06-12 LAB — T4, FREE: FREE T4: 0.97 ng/dL (ref 0.60–1.60)

## 2013-06-12 LAB — TSH: TSH: 0.17 u[IU]/mL — ABNORMAL LOW (ref 0.35–5.50)

## 2013-06-12 LAB — FOLLICLE STIMULATING HORMONE: FSH: 12.3 m[IU]/mL

## 2013-06-12 NOTE — Assessment & Plan Note (Signed)
Post resection and ablation related to thyroid cancer 2004 Follows with endo regularly for same (at [facility]) - working to wean T3 to lowest possible dose and ongoing T4 previously very sensitive to hormone dose changes - ?current symptoms related to same Interval hx reviewed - Check labs today recommended follow up endo as planned  Lab Results  Component Value Date   TSH 0.13* 02/25/2013

## 2013-06-12 NOTE — Patient Instructions (Addendum)
It was good to see you today.  We have reviewed your prior records including labs and tests today  Test(s) ordered today. Your results will be released to [place] (or called to you) after review, usually within 72hours after test completion. If any changes need to be made, you will be notified at that same time.  we'll make referral to Dr Garwin Brothers for evaluation of your symptoms. Our office will contact you regarding appointment(s) once made.  Remember to for extra hydration 32 ounces daily and try probiotic once daily for next 30 days  Followup with your thyroid specialist as planned  please call if symptoms unimproved in next 30 days, sooner if worse  Perimenopause Perimenopause is the time when your body begins to move into the menopause (no menstrual period for 12 straight months). It is a natural process. Perimenopause can begin 2 to 8 years before the menopause and usually lasts for one year after the menopause. During this time, your ovaries may or may not produce an egg. The ovaries vary in their production of estrogen and progesterone hormones each month. This can cause irregular menstrual periods, difficulty in getting pregnant, vaginal bleeding between periods and uncomfortable symptoms. CAUSES  Irregular production of the ovarian hormones, estrogen and progesterone, and not ovulating every month.  Other causes include:  Tumor of the pituitary gland in the brain.  Medical disease that affects the ovaries.  Radiation treatment.  Chemotherapy.  Unknown causes.  Heavy smoking and excessive alcohol intake can bring on perimenopause sooner. SYMPTOMS   Hot flashes.  Night sweats.  Irregular menstrual periods.  Decrease sex drive.  Vaginal dryness.  Headaches.  Mood swings.  Depression.  Memory problems.  Irritability.  Tiredness.  Weight gain.  Trouble getting pregnant.  The beginning of losing bone cells (osteoporosis).  The beginning of hardening  of the arteries (atherosclerosis). DIAGNOSIS  Your caregiver will make a diagnosis by analyzing your age, menstrual history and your symptoms. They will do a physical exam noting any changes in your body, especially your female organs. Female hormone tests may or may not be helpful depending on the amount and when you produce the female hormones. However, other hormone tests may be helpful (ex. thyroid hormone) to rule out other problems. TREATMENT  The decision to treat during the perimenopause should be made by you and your caregiver depending on how the symptoms are affecting you and your life style. There are various treatments available such as:  Treating individual symptoms with a specific medication for that symptom (ex. tranquilizer for depression).  Herbal medications that can help specific symptoms.  Counseling.  Group therapy.  No treatment. HOME CARE INSTRUCTIONS   Before seeing your caregiver, make a list of your menstrual periods (when the occur, how heavy they are, how long between periods and how long they last), your symptoms and when they started.  Take the medication as recommended by your caregiver.  Sleep and rest.  Exercise.  Eat a diet that contains calcium (good for your bones) and soy (acts like estrogen hormone).  Do not smoke.  Avoid alcoholic beverages.  Taking vitamin E may help in certain cases.  Take calcium and vitamin D supplements to help prevent bone loss.  Group therapy is sometimes helpful.  Acupuncture may help in some cases. SEEK MEDICAL CARE IF:   You have any of the above and want to know if it is perimenopause.  You want advice and treatment for any of your symptoms mentioned above.  You need a referral to a specialist (gynecologist, psychiatrist or psychologist). SEEK IMMEDIATE MEDICAL CARE IF:   You have vaginal bleeding.  Your period lasts longer than 8 days.  You periods are recurring sooner than 21 days.  You have  bleeding after intercourse.  You have severe depression.  You have pain when you urinate.  You have severe headaches.  You develop vision problems. Document Released: 06/28/2004 Document Revised: 08/13/2011 Document Reviewed: 12/18/2012 [facility] Patient Information 2014 [place], [place].

## 2013-06-12 NOTE — Progress Notes (Signed)
Pre-visit discussion using our clinic review tool. No additional management support is needed unless otherwise documented below in the visit note.  

## 2013-06-12 NOTE — Progress Notes (Signed)
   Subjective:    Patient ID: Destiny Douglas, female    DOB: 03-01-1971, 43 y.o.   MRN: [id number]  Headache  Pertinent negatives include no coughing, eye redness, fever, numbness, seizures or weakness.    Here with several concerns: mild intermittent headache x3 months, amenorrhea greater than 2 months, generalized fatigue  Denies medication changes No head trauma or vision changes  Past Medical History  Diagnosis Date  . THYROID CANCER 2004    s/p ablation  . Anxiety state, unspecified   . RHINITIS   . OSTEOPENIA   . INSOMNIA   . BREAST CYST, LEFT 08/28/2007  . HYPOTHYROIDISM     post ablation for malignancy    Review of Systems  Constitutional: Positive for fatigue. Negative for fever and unexpected weight change.  Eyes: Negative for redness and visual disturbance.  Respiratory: Negative for cough and shortness of breath.   Cardiovascular: Negative for chest pain, palpitations and leg swelling.  Genitourinary: Positive for menstrual problem. Negative for urgency, vaginal bleeding, vaginal discharge and pelvic pain.  Neurological: Positive for light-headedness (occ) and headaches. Negative for tremors, seizures, syncope, speech difficulty, weakness and numbness.       Objective:   Physical Exam BP 100/72  Pulse 92  Temp(Src) 98 F (36.7 C) (Oral)  Wt 96 lb 12.8 oz (43.908 kg)  SpO2 97% Wt Readings from Last 3 Encounters:  06/12/13 96 lb 12.8 oz (43.908 kg)  03/02/13 98 lb 6.4 oz (44.634 kg)  12/16/12 97 lb 12.8 oz (44.362 kg)   Constitutional: She appears well-developed and well-nourished. No distress.  Neck: Normal range of motion. Neck supple. No JVD present. No thyromegaly present.  Cardiovascular: Normal rate, regular rhythm and normal heart sounds.  No murmur heard. No BLE edema. Pulmonary/Chest: Effort normal and breath sounds normal. No respiratory distress. She has no wheezes.  GU - defer to gyn Neurological: She is alert and oriented to person, place,  and time. No cranial nerve deficit. Coordination, balance, strength, speech and gait are normal.  Skin: Skin is warm and dry. No rash noted. No erythema.  Psychiatric: She has a normal mood and affect. Her behavior is normal. Judgment and thought content normal.   Lab Results  Component Value Date   WBC 6.4 02/25/2013   HGB 14.7 02/25/2013   HCT 41.8 02/25/2013   PLT 236.0 02/25/2013   GLUCOSE 85 02/25/2013   CHOL 180 02/25/2013   TRIG 98.0 02/25/2013   HDL 61.70 02/25/2013   LDLCALC 99 02/25/2013   ALT 20 02/25/2013   AST 22 02/25/2013   NA 138 02/25/2013   K 4.1 02/25/2013   CL 104 02/25/2013   CREATININE 0.7 02/25/2013   BUN 10 02/25/2013   CO2 29 02/25/2013   TSH 0.13* 02/25/2013   HGBA1C 4.8 11/24/2012       Assessment & Plan:   Nonspecific symptoms including mild intermittent headache, abdominal bloating, back cramping and amenorrhea for 2 months  Suspect perimenopausal symptoms  Check LH and FSH, referred to gynecology for further evaluation of same  Also see problem list, medications reviewed and updated

## 2014-03-01 ENCOUNTER — Ambulatory Visit: Payer: BC Managed Care – PPO | Admitting: Internal Medicine

## 2014-03-01 DIAGNOSIS — Z0289 Encounter for other administrative examinations: Secondary | ICD-10-CM

## 2014-06-14 LAB — CBC AND DIFFERENTIAL
HCT: 41 % (ref 36–46)
Hemoglobin: 14.3 g/dL (ref 12.0–16.0)
Platelets: 236 10*3/uL (ref 150–399)
WBC: 5.5 10*3/mL

## 2014-06-14 LAB — TSH: TSH: 0.01 u[IU]/mL — AB (ref 0.41–5.90)

## 2014-06-14 LAB — BASIC METABOLIC PANEL
BUN: 17 mg/dL (ref 4–21)
CREATININE: 0.7 mg/dL (ref 0.5–1.1)
Glucose: 86 mg/dL
Potassium: 4.2 mmol/L (ref 3.4–5.3)
SODIUM: 141 mmol/L (ref 137–147)

## 2014-06-14 LAB — LIPID PANEL
CHOLESTEROL: 158 mg/dL (ref 0–200)
HDL: 56 mg/dL (ref 35–70)
LDL Cholesterol: 81 mg/dL
Triglycerides: 107 mg/dL (ref 40–160)

## 2014-06-14 LAB — HEPATIC FUNCTION PANEL
ALK PHOS: 65 U/L (ref 25–125)
ALT: 18 U/L (ref 7–35)
AST: 20 U/L (ref 13–35)
BILIRUBIN, TOTAL: 0.5 mg/dL

## 2014-06-14 LAB — VITAMIN D 25 HYDROXY (VIT D DEFICIENCY, FRACTURES): Vit D, 25-Hydroxy: 36.9

## 2014-06-14 LAB — HEMOGLOBIN A1C: Hgb A1c MFr Bld: 5.2 % (ref 4.0–6.0)

## 2014-06-24 ENCOUNTER — Other Ambulatory Visit (INDEPENDENT_AMBULATORY_CARE_PROVIDER_SITE_OTHER): Payer: BLUE CROSS/BLUE SHIELD

## 2014-06-24 ENCOUNTER — Ambulatory Visit (INDEPENDENT_AMBULATORY_CARE_PROVIDER_SITE_OTHER): Payer: BLUE CROSS/BLUE SHIELD | Admitting: Internal Medicine

## 2014-06-24 ENCOUNTER — Encounter: Payer: Self-pay | Admitting: Internal Medicine

## 2014-06-24 VITALS — BP 106/62 | HR 87 | Temp 98.2°F | Resp 14 | Ht <= 58 in | Wt 98.8 lb

## 2014-06-24 DIAGNOSIS — E038 Other specified hypothyroidism: Secondary | ICD-10-CM

## 2014-06-24 DIAGNOSIS — R0600 Dyspnea, unspecified: Secondary | ICD-10-CM

## 2014-06-24 DIAGNOSIS — R0602 Shortness of breath: Secondary | ICD-10-CM

## 2014-06-24 DIAGNOSIS — R0601 Orthopnea: Secondary | ICD-10-CM

## 2014-06-24 LAB — BRAIN NATRIURETIC PEPTIDE: PRO B NATRI PEPTIDE: 61 pg/mL (ref 0.0–100.0)

## 2014-06-24 NOTE — Patient Instructions (Addendum)
We would like you to start taking the flonase you have at home for the next week or so to help dry up the sinuses and make your throat feel better.   We have done an EKG on you today which has not changed from before and looks normal.   I was not able to find those lab results from the gynecologist but will have Dr. Katheren Puller assistant look for them and they can be reviewed. If we are not able to find them we will contact the office to get them so we do not duplicate labs.   We will check a blood test to check for any heart problems that could be causing breathing difficulties at night time.   We would recommend that you come back to discuss these other problems with Dr. Asa Lente if they are still going on since we were not able to get to all of them today.

## 2014-06-24 NOTE — Progress Notes (Signed)
Pre visit review using our clinic review tool, if applicable. No additional management support is needed unless otherwise documented below in the visit note. 

## 2014-06-27 DIAGNOSIS — R0600 Dyspnea, unspecified: Secondary | ICD-10-CM | POA: Insufficient documentation

## 2014-06-27 NOTE — Assessment & Plan Note (Signed)
Unlikely to be heart failure. EKG normal during visit, no chest pains or anginal symptoms. Check proBNP to see if any signs of heart failure. Would not recommend echo at this time. Seems more related to the spikes of T3 associated with medication usage.

## 2014-06-27 NOTE — Assessment & Plan Note (Signed)
Continues to follow with endocrinology for suppression of thyroid. She is taking T3 once every 8-9 days.

## 2014-06-27 NOTE — Progress Notes (Signed)
   Subjective:    Patient ID: Destiny Douglas, female    DOB: 13-Feb-1971, 44 y.o.   MRN: [id number]  HPI The patient is a 44 YO female who is coming in today for annual exam. She is having some complaints which she also wishes to discuss. She is still taking T3 and levothyroxine for her thyroid condition and she thinks that it causes her to be SOB at night time. She never notices it during the day and no chest pains during the day or night. She has been trying to wean herself off of the T3 and when she was off it she did not notice the breathing problems. She has several other complaints however due to the fact that she was late to the appointment we were unable to address them fully.   Review of Systems  Constitutional: Negative for fever, activity change, appetite change, fatigue and unexpected weight change.  HENT: Negative.   Respiratory: Positive for shortness of breath. Negative for cough, chest tightness and wheezing.   Cardiovascular: Negative for chest pain, palpitations and leg swelling.  Gastrointestinal: Negative.   Musculoskeletal: Negative.   Neurological: Negative.   Psychiatric/Behavioral: Negative.       Objective:   Physical Exam  Constitutional: She is oriented to person, place, and time. She appears well-developed and well-nourished.  HENT:  Head: Normocephalic and atraumatic.  Eyes: EOM are normal.  Neck: Normal range of motion.  Cardiovascular: Normal rate and regular rhythm.   Pulmonary/Chest: Effort normal and breath sounds normal. No respiratory distress. She has no wheezes.  Abdominal: Soft. Bowel sounds are normal.  Musculoskeletal: She exhibits no edema.  Neurological: She is alert and oriented to person, place, and time. Coordination normal.  Skin: Skin is warm and dry.   Filed Vitals:   06/24/14 0859  BP: 106/62  Pulse: 87  Temp: 98.2 F (36.8 C)  TempSrc: Oral  Resp: 14  Height: 4\' 10"  (1.473 m)  Weight: 98 lb 12.8 oz (44.815 kg)  SpO2: 98%    EKG: Rate 89, normal axis, normal intervals, no st or t wave changes    Assessment & Plan:

## 2014-06-30 ENCOUNTER — Encounter: Payer: Self-pay | Admitting: Internal Medicine

## 2014-09-14 ENCOUNTER — Encounter (HOSPITAL_COMMUNITY): Payer: Self-pay | Admitting: *Deleted

## 2014-09-14 ENCOUNTER — Emergency Department (HOSPITAL_COMMUNITY)
Admission: EM | Admit: 2014-09-14 | Discharge: 2014-09-14 | Disposition: A | Discharge status: Home / Self Care | Payer: BLUE CROSS/BLUE SHIELD | Source: Home / Self Care | Attending: Family Medicine | Admitting: Family Medicine

## 2014-09-14 DIAGNOSIS — H6011 Cellulitis of right external ear: Secondary | ICD-10-CM

## 2014-09-14 DIAGNOSIS — H9201 Otalgia, right ear: Secondary | ICD-10-CM

## 2014-09-14 MED ORDER — CEPHALEXIN 500 MG PO CAPS: 500.0000 mg | ORAL_CAPSULE | Freq: Four times a day (QID) | ORAL | Status: DC

## 2014-09-14 NOTE — Discharge Instructions (Signed)
Cellulitis Cellulitis is an infection of the skin and the tissue under the skin. The infected area is usually red and tender. This happens most often in the arms and lower legs. HOME CARE   Take your antibiotic medicine as told. Finish the medicine even if you start to feel better.  Keep the infected arm or leg raised (elevated).  Put a warm cloth on the area up to 4 times per day.  Only take medicines as told by your doctor.  Keep all doctor visits as told. GET HELP IF:  You see red streaks on the skin coming from the infected area.  Your red area gets bigger or turns a dark color.  Your bone or joint under the infected area is painful after the skin heals.  Your infection comes back in the same area or different area.  You have a puffy (swollen) bump in the infected area.  You have new symptoms.  You have a fever. GET HELP RIGHT AWAY IF:   You feel very sleepy.  You throw up (vomit) or have watery poop (diarrhea).  You feel sick and have muscle aches and pains. MAKE SURE YOU:   Understand these instructions.  Will watch your condition.  Will get help right away if you are not doing well or get worse. Document Released: 11/07/2007 Document Revised: 10/05/2013 Document Reviewed: 08/06/2011 [facility] Patient Information 2015 [place], [place]. This information is not intended to replace advice given to you by your health care provider. Make sure you discuss any questions you have with your health care provider.  Otalgia Otalgia is pain in or around the ear. When the pain is from the ear itself it is called primary otalgia. Pain may also be coming from somewhere else, like the head and neck. This is called secondary otalgia.  CAUSES  Causes of primary otalgia include:  Middle ear infection.  It can also be caused by injury to the ear or infection of the ear canal (swimmer's ear). Swimmer's ear causes pain, swelling and often drainage from the ear canal. Causes of  secondary otalgia include:  Sinus infections.  Allergies and colds that cause stuffiness of the nose and tubes that drain the ears (eustachian tubes).  Dental problems like cavities, gum infections or teething.  Sore Throat (tonsillitis and pharyngitis).  Swollen glands in the neck.  Infection of the bone behind the ear (mastoiditis).  TMJ discomfort (problems with the joint between your jaw and your skull).  Other problems such as nerve disorders, circulation problems, heart disease and tumors of the head and neck can also cause symptoms of ear pain. This is rare. DIAGNOSIS  Evaluation, Diagnosis and Testing:  Examination by your medical caregiver is recommended to evaluate and diagnose the cause of otalgia.  Further testing or referral to a specialist may be indicated if the cause of the ear pain is not found and the symptom persists. TREATMENT   Your doctor may prescribe antibiotics if an ear infection is diagnosed.  Pain relievers and topical analgesics may be recommended.  It is important to take all medications as prescribed. HOME CARE INSTRUCTIONS   It may be helpful to sleep with the painful ear in the up position.  A warm compress over the painful ear may provide relief.  A soft diet and avoiding gum may help while ear pain is present. SEEK IMMEDIATE MEDICAL CARE IF:  You develop severe pain, a high fever, repeated vomiting or dehydration.  You develop extreme dizziness, headache, confusion, ringing  in the ears (tinnitus) or hearing loss. Document Released: 06/28/2004 Document Revised: 08/13/2011 Document Reviewed: 03/30/2009 [facility] Patient Information 2015 [place], [place]. This information is not intended to replace advice given to you by your health care provider. Make sure you discuss any questions you have with your health care provider.

## 2014-09-14 NOTE — ED Provider Notes (Signed)
CSN: [id number]     Arrival date & time 09/14/14  1946 History   First MD Initiated Contact with Patient 09/14/14 2056     Chief Complaint  Patient presents with  . Otalgia   (Consider location/radiation/quality/duration/timing/severity/associated sxs/prior Treatment) HPI Comments: 44 year old female presents with right earache since yesterday. Rather quick onset. She denies  or other allergy symptoms. Denies problems with hearing. Denies headache.    Past Medical History  Diagnosis Date  . Anxiety state, unspecified   . RHINITIS   . OSTEOPENIA     Pt. denies any bone isssues.  Has had normal bone density tests  . INSOMNIA   . BREAST CYST, LEFT 08/28/2007  . HYPOTHYROIDISM     post ablation for malignancy  . THYROID CANCER 2004    s/p ablation   Past Surgical History  Procedure Laterality Date  . Thyroidectomy  2004    1/2 2002 and the other half 2004  . Breast surgery      breast biopsy, right   Family History  Problem Relation Age of Onset  . Breast cancer Mother   . Coronary artery disease Father   . Hyperlipidemia Father   . Hypertension Father   . Hypothyroidism Sister    History  Substance Use Topics  . Smoking status: Never Smoker   . Smokeless tobacco: Not on file     Comment: Married, lives with husband and their son. 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 Alcohol Use: Yes     Comment: occasional glass of wine   OB History    No data available     Review of Systems  Constitutional: Negative.   HENT: Positive for ear pain. Negative for facial swelling, rhinorrhea, sinus pressure, sneezing, sore throat and tinnitus.   Respiratory: Negative.   Cardiovascular: Negative.     Allergies  Review of patient's allergies indicates no known allergies.  Home Medications   Prior to Admission medications   Medication Sig Start Date End Date Taking? Authorizing Provider  Calcium 500-125 MG-UNIT TABS Take by  mouth. Take two by mouth daily.   Yes Historical Provider, MD  Calcium Carbonate-Vitamin D (CALCIUM + D) 600-200 MG-UNIT TABS Take by mouth 2 (two) times daily.     Yes Historical Provider, MD  levothyroxine (SYNTHROID, LEVOTHROID) 88 MCG tablet Take 88 mcg by mouth daily. Take 1 six days a week 05/28/11  Yes Rowe Clack, MD  liothyronine (CYTOMEL) 5 MCG tablet Take 2.5 mcg by mouth daily.   Yes Historical Provider, MD  Multiple Vitamin (MULTIVITAMIN) tablet Take 1 tablet by mouth.   Yes Historical Provider, MD  cephALEXin (KEFLEX) 500 MG capsule Take 1 capsule (500 mg total) by mouth 4 (four) times daily. 09/14/14   Janne Napoleon, NP  levothyroxine (LEVOXYL) 75 MCG tablet 1 day per week or as directed 05/28/11   Rowe Clack, MD   BP 106/72 mmHg  Pulse 74  Temp(Src) 98.1 F (36.7 C) (Oral)  Resp 12  SpO2 100%  LMP 09/10/2014 Physical Exam  Constitutional: She is oriented to person, place, and time. She appears well-developed and well-nourished. No distress.  HENT:  Left Ear: External ear normal.  Mouth/Throat: Oropharynx is clear and moist. No oropharyngeal exudate.  Bilateral TMs are normal. Bilateral EACs are clear and without erythema, swelling or drainage. There is tenderness over the tragus. There is no erythema. No tenderness to the TMJ or pterygoid muscle.  Neck: Normal range  of motion. Neck supple.  Pulmonary/Chest: Effort normal.  Lymphadenopathy:    She has no cervical adenopathy.  Neurological: She is alert and oriented to person, place, and time.  Skin: Skin is warm and dry.  Psychiatric: She has a normal mood and affect.  Nursing note and vitals reviewed.   ED Course  Procedures (including critical care time) Labs Review Labs Reviewed - No data to display  Imaging Review No results found.   MDM   1. Otalgia of right ear   2. Cellulitis of ear, right    This probably an early cellulitis of the tragus. There is no erythema or swelling but there is  point tenderness. We will treat with Keflex, heat and ibuprofen.    Janne Napoleon, NP 09/14/14 2132

## 2014-09-14 NOTE — ED Notes (Signed)
C/o R earache onset yesterday.  No cold symptoms.

## 2014-10-01 ENCOUNTER — Telehealth: Payer: Self-pay | Admitting: Internal Medicine

## 2014-10-01 NOTE — Telephone Encounter (Signed)
Error

## 2014-10-02 ENCOUNTER — Encounter: Payer: Self-pay | Admitting: Internal Medicine

## 2014-10-02 ENCOUNTER — Ambulatory Visit (INDEPENDENT_AMBULATORY_CARE_PROVIDER_SITE_OTHER): Payer: BLUE CROSS/BLUE SHIELD | Admitting: Internal Medicine

## 2014-10-02 VITALS — BP 120/80 | Temp 98.3°F | Wt 96.0 lb

## 2014-10-02 DIAGNOSIS — H6010 Cellulitis of external ear, unspecified ear: Secondary | ICD-10-CM

## 2014-10-02 DIAGNOSIS — H6011 Cellulitis of right external ear: Secondary | ICD-10-CM | POA: Diagnosis not present

## 2014-10-02 HISTORY — DX: Cellulitis of external ear, unspecified ear: H60.10

## 2014-10-02 MED ORDER — CEPHALEXIN 500 MG PO CAPS: 500.0000 mg | ORAL_CAPSULE | Freq: Three times a day (TID) | ORAL | Status: DC

## 2014-10-02 NOTE — Assessment & Plan Note (Signed)
She had sense of the infection returning but looks better today Will give Rx for cephalexin in case it worsens Discussed earrings, etc

## 2014-10-02 NOTE — Progress Notes (Signed)
   Subjective:    Patient ID: Destiny Douglas, female    DOB: 1971-01-01, 43 y.o.   MRN: [id number]  HPI Here due to right ear pain  Was having soft tissue pain in right ear so seen in urgent care Keflex for 1 week--this seemed to clear it up Off for about 10 days Yesterday started feeling pain in the ear again Afraid it might be coming back  This morning it feels better  No trauma No recent new earrings  No fever No swimming No ear discharge  Current Outpatient Prescriptions on File Prior to Visit  Medication Sig Dispense Refill  . Calcium 500-125 MG-UNIT TABS Take by mouth. Take two by mouth daily.    Marland Kitchen levothyroxine (LEVOXYL) 75 MCG tablet 1 day per week or as directed    . levothyroxine (SYNTHROID, LEVOTHROID) 88 MCG tablet Take 88 mcg by mouth daily. Take 1 six days a week    . liothyronine (CYTOMEL) 5 MCG tablet Take 2.5 mcg by mouth daily.    . Multiple Vitamin (MULTIVITAMIN) tablet Take 1 tablet by mouth.     No current facility-administered medications on file prior to visit.    Allergies  Allergen Reactions  . Lactose Intolerance (Gi)     Past Medical History  Diagnosis Date  . Anxiety state, unspecified   . RHINITIS   . OSTEOPENIA     Pt. denies any bone isssues.  Has had normal bone density tests  . INSOMNIA   . BREAST CYST, LEFT 08/28/2007  . HYPOTHYROIDISM     post ablation for malignancy  . THYROID CANCER 2004    s/p ablation    Past Surgical History  Procedure Laterality Date  . Thyroidectomy  2004    1/2 2002 and the other half 2004  . Breast surgery      breast biopsy, right    Family History  Problem Relation Age of Onset  . Breast cancer Mother   . Coronary artery disease Father   . Hyperlipidemia Father   . Hypertension Father   . Hypothyroidism Sister     History   Social History  . Marital Status: Married    Spouse Name: N/A  . Number of Children: N/A  . Years of Education: N/A   Occupational History  . Not on file.    Social History Main Topics  . Smoking status: Never Smoker   . Smokeless tobacco: Not on file     Comment: Married, lives with husband and their son. 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 Alcohol Use: Yes     Comment: occasional glass of wine  . Drug Use: No  . Sexual Activity: Yes    Birth Control/ Protection: Condom   Other Topics Concern  . Not on file   Social History Narrative   Review of Systems Feels fine No problems with the cephalexin--except it seemed to clear a chronic rash on left calf    Objective:   Physical Exam  Constitutional: She appears well-developed and well-nourished. No distress.  HENT:  Both canals are normal TMs normal No pinna/tragal redness or tenderness          Assessment & Plan:

## 2014-10-02 NOTE — Progress Notes (Signed)
Pre visit review using our clinic review tool, if applicable. No additional management support is needed unless otherwise documented below in the visit note. 

## 2015-06-07 ENCOUNTER — Encounter: Payer: Self-pay | Admitting: Internal Medicine

## 2015-06-07 ENCOUNTER — Ambulatory Visit (INDEPENDENT_AMBULATORY_CARE_PROVIDER_SITE_OTHER): Payer: BLUE CROSS/BLUE SHIELD | Admitting: Internal Medicine

## 2015-06-07 VITALS — BP 102/60 | HR 62 | Temp 97.6°F | Ht <= 58 in | Wt 97.0 lb

## 2015-06-07 DIAGNOSIS — R42 Dizziness and giddiness: Secondary | ICD-10-CM

## 2015-06-07 DIAGNOSIS — R001 Bradycardia, unspecified: Secondary | ICD-10-CM | POA: Diagnosis not present

## 2015-06-07 DIAGNOSIS — F411 Generalized anxiety disorder: Secondary | ICD-10-CM | POA: Diagnosis not present

## 2015-06-07 DIAGNOSIS — E038 Other specified hypothyroidism: Secondary | ICD-10-CM | POA: Diagnosis not present

## 2015-06-07 NOTE — Progress Notes (Signed)
Pre visit review using our clinic review tool, if applicable. No additional management support is needed unless otherwise documented below in the visit note. 

## 2015-06-07 NOTE — Progress Notes (Signed)
Subjective:    Patient ID: Destiny Douglas, female    DOB: 28-Aug-1970, 45 y.o.   MRN: [id number]  HPI   Here to f/u with concern about low HR in the 40's at night by her Fitbit that she learns about in restrospect in the AM.  Pt denies chest pain, increased sob or doe, wheezing, orthopnea, PND, increased LE swelling, palpitations, dizziness or syncope, except on occasion also will awaken in the night with unexplained dyspnea.  No snoring or known sleep apneam no hx of asthma.  Denies worsening depressive symptoms, suicidal ideation, or panic; but has ongoing anxiety.  Pt denies new neurological symptoms such as new headache, or facial or extremity weakness or numbness   Pt denies polydipsia, polyuria,  Also mentions a concern about dizziness especialy with bending at the waist while standing, then straightening again.  Is on high dose suppressive thyroid tx per endo, does not feel she is being overtreated currently as she has had extreme jitteriness and anxiety in the past with this, but not recently. Past Medical History  Diagnosis Date  . Anxiety state, unspecified   . RHINITIS   . OSTEOPENIA     Pt. denies any bone isssues.  Has had normal bone density tests  . INSOMNIA   . BREAST CYST, LEFT 08/28/2007  . HYPOTHYROIDISM     post ablation for malignancy  . THYROID CANCER 2004    s/p ablation   Past Surgical History  Procedure Laterality Date  . Thyroidectomy  2004    1/2 2002 and the other half 2004  . Breast surgery      breast biopsy, right    reports that she has never smoked. She does not have any smokeless tobacco history on file. She reports that she drinks alcohol. She reports that she does not use illicit drugs. family history includes Breast cancer in her mother; Coronary artery disease in her father; Hyperlipidemia in her father; Hypertension in her father; Hypothyroidism in her sister. Allergies  Allergen Reactions  . Lactose Intolerance (Gi)    Current Outpatient  Prescriptions on File Prior to Visit  Medication Sig Dispense Refill  . Calcium 500-125 MG-UNIT TABS Take by mouth. Take two by mouth daily.    Marland Kitchen levothyroxine (LEVOXYL) 75 MCG tablet 1 day per week or as directed    . levothyroxine (SYNTHROID, LEVOTHROID) 88 MCG tablet Take 88 mcg by mouth daily. Take 1 six days a week    . liothyronine (CYTOMEL) 5 MCG tablet Take 2.5 mcg by mouth daily.    . Multiple Vitamin (MULTIVITAMIN) tablet Take 1 tablet by mouth.    . cephALEXin (KEFLEX) 500 MG capsule Take 1 capsule (500 mg total) by mouth 3 (three) times daily. (Patient not taking: Reported on 06/07/2015) 30 capsule 0   No current facility-administered medications on file prior to visit.   Review of Systems  Constitutional: Negative for unusual diaphoresis or night sweats HENT: Negative for ringing in ear or discharge Eyes: Negative for double vision or worsening visual disturbance.  Respiratory: Negative for choking and stridor.   Gastrointestinal: Negative for vomiting or other signifcant bowel change Genitourinary: Negative for hematuria or change in urine volume.  Musculoskeletal: Negative for other MSK pain or swelling Skin: Negative for color change and worsening wound.  Neurological: Negative for tremors and numbness other than noted  Psychiatric/Behavioral: Negative for decreased concentration or agitation other than above       Objective:   Physical Exam BP 102/60 mmHg  Pulse 62  Temp(Src) 97.6 F (36.4 C) (Oral)  Ht 4\' 10"  (1.473 m)  Wt 97 lb (43.999 kg)  BMI 20.28 kg/m2  SpO2 98% VS noted,  Constitutional: Pt appears in no significant distress HENT: Head: NCAT.  Right Ear: External ear normal.  Left Ear: External ear normal.  Eyes: . Pupils are equal, round, and reactive to light. Conjunctivae and EOM are normal Neck: Normal range of motion. Neck supple.  Cardiovascular: Normal rate and regular rhythm.   Pulmonary/Chest: Effort normal and breath sounds without rales or  wheezing.  Abd:  Soft, NT, ND, + BS Neurological: Pt is alert. Not confused , motor grossly intact Skin: Skin is warm. No rash, no LE edema Psychiatric: Pt behavior is normal. No agitation.     Assessment & Plan:

## 2015-06-07 NOTE — Patient Instructions (Signed)
You will be contacted regarding the referral for: Overnight oximetry, and echocardiogram  Please continue all other medications as before, and refills have been done if requested.  Please have the pharmacy call with any other refills you may need.  Please keep your appointments with your specialists as you may have planned

## 2015-06-08 NOTE — Assessment & Plan Note (Signed)
Pt very concerned, but may be physiological for her; will assess with overnight oximetry given the low HR and dyspnea, consider holter monitor, consider card consult

## 2015-06-08 NOTE — Assessment & Plan Note (Signed)
Pt declines specifi tx trial such as ssri today,  to f/u any worsening symptoms or concerns

## 2015-06-08 NOTE — Assessment & Plan Note (Signed)
To cont high dose suppressive tx per endo given hx of thryoid ca

## 2015-06-08 NOTE — Assessment & Plan Note (Signed)
Etiology unclear, may be physiological for her, doubt dysautomia, decliens ecg today, but for echo r/o structural abnormal

## 2015-06-21 ENCOUNTER — Other Ambulatory Visit: Payer: Self-pay

## 2015-06-21 ENCOUNTER — Ambulatory Visit (HOSPITAL_COMMUNITY): Payer: BLUE CROSS/BLUE SHIELD | Attending: Internal Medicine

## 2015-06-21 DIAGNOSIS — R42 Dizziness and giddiness: Secondary | ICD-10-CM

## 2015-06-21 DIAGNOSIS — R001 Bradycardia, unspecified: Secondary | ICD-10-CM | POA: Diagnosis not present

## 2015-06-21 DIAGNOSIS — E039 Hypothyroidism, unspecified: Secondary | ICD-10-CM | POA: Diagnosis not present

## 2015-06-21 DIAGNOSIS — R0600 Dyspnea, unspecified: Secondary | ICD-10-CM | POA: Diagnosis present

## 2015-06-22 ENCOUNTER — Encounter: Payer: Self-pay | Admitting: Internal Medicine

## 2015-07-04 ENCOUNTER — Encounter: Payer: Self-pay | Admitting: Internal Medicine

## 2015-07-04 ENCOUNTER — Ambulatory Visit (INDEPENDENT_AMBULATORY_CARE_PROVIDER_SITE_OTHER): Payer: BLUE CROSS/BLUE SHIELD | Admitting: Internal Medicine

## 2015-07-04 VITALS — BP 110/64 | HR 76 | Temp 97.8°F | Resp 16 | Wt 100.0 lb

## 2015-07-04 DIAGNOSIS — E890 Postprocedural hypothyroidism: Secondary | ICD-10-CM

## 2015-07-04 DIAGNOSIS — C73 Malignant neoplasm of thyroid gland: Secondary | ICD-10-CM

## 2015-07-04 DIAGNOSIS — Z0000 Encounter for general adult medical examination without abnormal findings: Secondary | ICD-10-CM | POA: Diagnosis not present

## 2015-07-04 MED ORDER — TRIAMCINOLONE ACETONIDE 0.1 % EX LOTN: TOPICAL_LOTION | Freq: Three times a day (TID) | CUTANEOUS | Status: AC

## 2015-07-04 NOTE — Assessment & Plan Note (Signed)
Follows with endocrine twice a year 

## 2015-07-04 NOTE — Progress Notes (Signed)
Subjective:    Patient ID: Destiny Douglas, female    DOB: [birth year], 45 y.o.   MRN: [id number]  HPI She is here to establish with a new pcp and for a PE.  Some nights she wakes up short of breath.  She thinks is occurs at least once a week. She wears a fitbit monitor and she noticed that her HR dropped into the 40's when she was SOB.  Her HR at night is only in the 40's when she feels SOB.  Rash on left lower leg;  She has had the rash for years but is getting better.  It does itch.  She tried steroid cream and it made it worse.  She stopped that and has been just applying otc creams.  Now using lemon essential oil and steroid cream together seems to keeping it stable.  It is not improving.    Medications and allergies reviewed with patient and updated if appropriate.  Patient Active Problem List   Diagnosis Date Noted  . Bradycardia 06/07/2015  . Dizziness 06/07/2015  . Nocturnal dyspnea 06/27/2014  . THYROID CANCER 01/11/2010  . Anxiety state 01/11/2010  . INSOMNIA 02/01/2009  . BREAST CYST, LEFT 08/28/2007  . OSTEOPENIA 08/28/2007  . Hypothyroidism 02/07/2007    Current Outpatient Prescriptions on File Prior to Visit  Medication Sig Dispense Refill  . Calcium 500-125 MG-UNIT TABS Take by mouth. Take two by mouth daily.    Marland Kitchen levothyroxine (LEVOXYL) 75 MCG tablet 1 day per week or as directed    . levothyroxine (SYNTHROID, LEVOTHROID) 88 MCG tablet Take 88 mcg by mouth daily. Take 1 six days a week    . liothyronine (CYTOMEL) 5 MCG tablet Take 2.5 mcg by mouth daily.    . Multiple Vitamin (MULTIVITAMIN) tablet Take 1 tablet by mouth.     No current facility-administered medications on file prior to visit.    Past Medical History  Diagnosis Date  . Anxiety state, unspecified   . RHINITIS   . OSTEOPENIA     Pt. denies any bone isssues.  Has had normal bone density tests  . INSOMNIA   . BREAST CYST, LEFT 08/28/2007  . HYPOTHYROIDISM     post ablation for malignancy    . THYROID CANCER 2004    s/p ablation  . Cellulitis of ear 10/02/2014    Past Surgical History  Procedure Laterality Date  . Thyroidectomy  2004    1/2 2002 and the other half 2004  . Breast surgery      breast biopsy, right    Social History   Social History  . Marital Status: Married    Spouse Name: N/A  . Number of Children: N/A  . Years of Education: N/A   Social History Main Topics  . Smoking status: Never Smoker   . Smokeless tobacco: None     Comment: Married, lives with husband and their son. 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 Alcohol Use: Yes     Comment: occasional glass of wine  . Drug Use: No  . Sexual Activity: Yes    Birth Control/ Protection: Condom   Other Topics Concern  . None   Social History Narrative    Family History  Problem Relation Age of Onset  . Breast cancer Mother   . Coronary artery disease Father   . Hyperlipidemia Father   . Hypertension Father   . Hypothyroidism Sister  Review of Systems  Constitutional: Negative for fever, chills, appetite change and unexpected weight change.       Hotflashes  HENT: Negative for hearing loss.   Eyes: Negative for visual disturbance.  Respiratory: Negative for cough, shortness of breath and wheezing.   Cardiovascular: Positive for palpitations (thyroid only). Negative for chest pain and leg swelling.  Gastrointestinal: Negative for nausea, abdominal pain, diarrhea, constipation and blood in stool.       No GERD  Genitourinary: Negative for dysuria and hematuria.  Musculoskeletal: Positive for arthralgias (sometimes, ? thyroid related, intermittent). Negative for myalgias and back pain.  Skin: Positive for rash (left lower leg, chronic).       No change in moles  Neurological: Positive for light-headedness (changing positions) and headaches (occasional). Negative for dizziness.  Psychiatric/Behavioral: Negative for dysphoric mood. The  patient is not nervous/anxious.        Objective:   Filed Vitals:   07/04/15 0841  BP: 110/64  Pulse: 76  Temp: 97.8 F (36.6 C)  Resp: 16   Filed Weights   07/04/15 0841  Weight: 100 lb (45.36 kg)   Body mass index is 20.91 kg/(m^2).   Physical Exam Constitutional: She appears well-developed and well-nourished. No distress.  HENT:  Head: Normocephalic and atraumatic.  Right Ear: External ear normal. Normal ear canal and TM Left Ear: External ear normal.  Normal ear canal and TM Mouth/Throat: Oropharynx is clear and moist.  Normal bilateral ear canals and tympanic membranes  Eyes: Conjunctivae and EOM are normal.  Neck: Neck supple. No tracheal deviation present. No thyromegaly present.  No carotid bruit  Cardiovascular: Normal rate, regular rhythm and normal heart sounds.   No murmur heard.  No edema. Pulmonary/Chest: Effort normal and breath sounds normal. No respiratory distress. She has no wheezes. She has no rales.  Breast: deferred to Gyn Abdominal: Soft. She exhibits no distension. There is no tenderness.  Lymphadenopathy: She has no cervical adenopathy.  Skin: Skin is warm and dry. She is not diaphoretic.  Psychiatric: She has a normal mood and affect. Her behavior is normal.         Assessment & Plan:   Physical exam: Screening blood work ordered Immunizations up to date Sees gyn annually Mammogram up to date EKG not indicated Exercise - running regularly Weight normal/controlled Skin will see derm regarding rash, no other concerning skin changes Substance abuse, no evidence of substance abuse  She has had several episodes of waking up at night with shortness of breath and saw that her HR was in the 40's  -- she will have a nighttime oximetry once she gets chance to do that -- advised her if that does not give an answer and she is still having these symptoms we may need to have her seen by cardio and have a month long holter since the symptoms are 1-2  times a week  See Problem List for Assessment and Plan of chronic medical problems.   Follow up annually

## 2015-07-04 NOTE — Progress Notes (Signed)
Pre visit review using our clinic review tool, if applicable. No additional management support is needed unless otherwise documented below in the visit note. 

## 2015-07-04 NOTE — Patient Instructions (Addendum)
We have reviewed your prior records including labs and tests today.  Test(s) ordered today. Your results will be released to [facility] (or called to you) after review, usually within 72hours after test completion. If any changes need to be made, you will be notified at that same time.  All other Health Maintenance issues reviewed.   All recommended immunizations and age-appropriate screenings are up-to-date.  No immunizations administered today.   Medications reviewed and updated.  No changes recommended at this time.  Please followup annually for a physical.    Health Maintenance, Female Adopting a healthy lifestyle and getting preventive care can go a long way to promote health and wellness. Talk with your health care provider about what schedule of regular examinations is right for you. This is a good chance for you to check in with your provider about disease prevention and staying healthy. In between checkups, there are plenty of things you can do on your own. Experts have done a lot of research about which lifestyle changes and preventive measures are most likely to keep you healthy. Ask your health care provider for more information. WEIGHT AND DIET  Eat a healthy diet  Be sure to include plenty of vegetables, fruits, low-fat dairy products, and lean protein.  Do not eat a lot of foods high in solid fats, added sugars, or salt.  Get regular exercise. This is one of the most important things you can do for your health.  Most adults should exercise for at least 150 minutes each week. The exercise should increase your heart rate and make you sweat (moderate-intensity exercise).  Most adults should also do strengthening exercises at least twice a week. This is in addition to the moderate-intensity exercise.  Maintain a healthy weight  Body mass index (BMI) is a measurement that can be used to identify possible weight problems. It estimates body fat based on height and weight. Your  health care provider can help determine your BMI and help you achieve or maintain a healthy weight.  For females 75 years of age and older:   A BMI below 18.5 is considered underweight.  A BMI of 18.5 to 24.9 is normal.  A BMI of 25 to 29.9 is considered overweight.  A BMI of 30 and above is considered obese.  Watch levels of cholesterol and blood lipids  You should start having your blood tested for lipids and cholesterol at 45 years of age, then have this test every 5 years.  You may need to have your cholesterol levels checked more often if:  Your lipid or cholesterol levels are high.  You are older than 45 years of age.  You are at high risk for heart disease.  CANCER SCREENING   Lung Cancer  Lung cancer screening is recommended for adults 32-54 years old who are at high risk for lung cancer because of a history of smoking.  A yearly low-dose CT scan of the lungs is recommended for people who:  Currently smoke.  Have quit within the past 15 years.  Have at least a 30-pack-year history of smoking. A pack year is smoking an average of one pack of cigarettes a day for 1 year.  Yearly screening should continue until it has been 15 years since you quit.  Yearly screening should stop if you develop a health problem that would prevent you from having lung cancer treatment.  Breast Cancer  Practice breast self-awareness. This means understanding how your breasts normally appear and feel.  It  also means doing regular breast self-exams. Let your health care provider know about any changes, no matter how small.  If you are in your 20s or 30s, you should have a clinical breast exam (CBE) by a health care provider every 1-3 years as part of a regular health exam.  If you are 45 or older, have a CBE every year. Also consider having a breast X-ray (mammogram) every year.  If you have a family history of breast cancer, talk to your health care provider about genetic  screening.  If you are at high risk for breast cancer, talk to your health care provider about having an MRI and a mammogram every year.  Breast cancer gene (BRCA) assessment is recommended for women who have family members with BRCA-related cancers. BRCA-related cancers include:  Breast.  Ovarian.  Tubal.  Peritoneal cancers.  Results of the assessment will determine the need for genetic counseling and BRCA1 and BRCA2 testing. Cervical Cancer Your health care provider may recommend that you be screened regularly for cancer of the pelvic organs (ovaries, uterus, and vagina). This screening involves a pelvic examination, including checking for microscopic changes to the surface of your cervix (Pap test). You may be encouraged to have this screening done every 3 years, beginning at age 73.  For women ages 57-65, health care providers may recommend pelvic exams and Pap testing every 3 years, or they may recommend the Pap and pelvic exam, combined with testing for human papilloma virus (HPV), every 5 years. Some types of HPV increase your risk of cervical cancer. Testing for HPV may also be done on women of any age with unclear Pap test results.  Other health care providers may not recommend any screening for nonpregnant women who are considered low risk for pelvic cancer and who do not have symptoms. Ask your health care provider if a screening pelvic exam is right for you.  If you have had past treatment for cervical cancer or a condition that could lead to cancer, you need Pap tests and screening for cancer for at least 20 years after your treatment. If Pap tests have been discontinued, your risk factors (such as having a new sexual partner) need to be reassessed to determine if screening should resume. Some women have medical problems that increase the chance of getting cervical cancer. In these cases, your health care provider may recommend more frequent screening and Pap tests. Colorectal  Cancer  This type of cancer can be detected and often prevented.  Routine colorectal cancer screening usually begins at 45 years of age and continues through 45 years of age.  Your health care provider may recommend screening at an earlier age if you have risk factors for colon cancer.  Your health care provider may also recommend using home test kits to check for hidden blood in the stool.  A small camera at the end of a tube can be used to examine your colon directly (sigmoidoscopy or colonoscopy). This is done to check for the earliest forms of colorectal cancer.  Routine screening usually begins at age 14.  Direct examination of the colon should be repeated every 5-10 years through 45 years of age. However, you may need to be screened more often if early forms of precancerous polyps or small growths are found. Skin Cancer  Check your skin from head to toe regularly.  Tell your health care provider about any new moles or changes in moles, especially if there is a change in a mole's  shape or color.  Also tell your health care provider if you have a mole that is larger than the size of a pencil eraser.  Always use sunscreen. Apply sunscreen liberally and repeatedly throughout the day.  Protect yourself by wearing long sleeves, pants, a wide-brimmed hat, and sunglasses whenever you are outside. HEART DISEASE, DIABETES, AND HIGH BLOOD PRESSURE   High blood pressure causes heart disease and increases the risk of stroke. High blood pressure is more likely to develop in:  People who have blood pressure in the high end of the normal range (130-139/85-89 mm Hg).  People who are overweight or obese.  People who are African American.  If you are 12-18 years of age, have your blood pressure checked every 3-5 years. If you are 37 years of age or older, have your blood pressure checked every year. You should have your blood pressure measured twice--once when you are at a hospital or clinic,  and once when you are not at a hospital or clinic. Record the average of the two measurements. To check your blood pressure when you are not at a hospital or clinic, you can use:  An automated blood pressure machine at a pharmacy.  A home blood pressure monitor.  If you are between 57 years and 37 years old, ask your health care provider if you should take aspirin to prevent strokes.  Have regular diabetes screenings. This involves taking a blood sample to check your fasting blood sugar level.  If you are at a normal weight and have a low risk for diabetes, have this test once every three years after 45 years of age.  If you are overweight and have a high risk for diabetes, consider being tested at a younger age or more often. PREVENTING INFECTION  Hepatitis B  If you have a higher risk for hepatitis B, you should be screened for this virus. You are considered at high risk for hepatitis B if:  You were born in a country where hepatitis B is common. Ask your health care provider which countries are considered high risk.  Your parents were born in a high-risk country, and you have not been immunized against hepatitis B (hepatitis B vaccine).  You have HIV or AIDS.  You use needles to inject street drugs.  You live with someone who has hepatitis B.  You have had sex with someone who has hepatitis B.  You get hemodialysis treatment.  You take certain medicines for conditions, including cancer, organ transplantation, and autoimmune conditions. Hepatitis C  Blood testing is recommended for:  Everyone born from [birth year] through 1965.  Anyone with known risk factors for hepatitis C. Sexually transmitted infections (STIs)  You should be screened for sexually transmitted infections (STIs) including gonorrhea and chlamydia if:  You are sexually active and are younger than 45 years of age.  You are older than 45 years of age and your health care provider tells you that you are at risk  for this type of infection.  Your sexual activity has changed since you were last screened and you are at an increased risk for chlamydia or gonorrhea. Ask your health care provider if you are at risk.  If you do not have HIV, but are at risk, it may be recommended that you take a prescription medicine daily to prevent HIV infection. This is called pre-exposure prophylaxis (PrEP). You are considered at risk if:  You are sexually active and do not regularly use condoms or know the  HIV status of your partner(s).  You take drugs by injection.  You are sexually active with a partner who has HIV. Talk with your health care provider about whether you are at high risk of being infected with HIV. If you choose to begin PrEP, you should first be tested for HIV. You should then be tested every 3 months for as long as you are taking PrEP.  PREGNANCY   If you are premenopausal and you may become pregnant, ask your health care provider about preconception counseling.  If you may become pregnant, take 400 to 800 micrograms (mcg) of folic acid every day.  If you want to prevent pregnancy, talk to your health care provider about birth control (contraception). OSTEOPOROSIS AND MENOPAUSE   Osteoporosis is a disease in which the bones lose minerals and strength with aging. This can result in serious bone fractures. Your risk for osteoporosis can be identified using a bone density scan.  If you are 73 years of age or older, or if you are at risk for osteoporosis and fractures, ask your health care provider if you should be screened.  Ask your health care provider whether you should take a calcium or vitamin D supplement to lower your risk for osteoporosis.  Menopause may have certain physical symptoms and risks.  Hormone replacement therapy may reduce some of these symptoms and risks. Talk to your health care provider about whether hormone replacement therapy is right for you.  HOME CARE INSTRUCTIONS    Schedule regular health, dental, and eye exams.  Stay current with your immunizations.   Do not use any tobacco products including cigarettes, chewing tobacco, or electronic cigarettes.  If you are pregnant, do not drink alcohol.  If you are breastfeeding, limit how much and how often you drink alcohol.  Limit alcohol intake to no more than 1 drink per day for nonpregnant women. One drink equals 12 ounces of beer, 5 ounces of wine, or 1 ounces of hard liquor.  Do not use street drugs.  Do not share needles.  Ask your health care provider for help if you need support or information about quitting drugs.  Tell your health care provider if you often feel depressed.  Tell your health care provider if you have ever been abused or do not feel safe at home.   This information is not intended to replace advice given to you by your health care provider. Make sure you discuss any questions you have with your health care provider.   Document Released: 12/04/2010 Document Revised: 06/11/2014 Document Reviewed: 04/22/2013 Elsevier Interactive Patient Education [employer].

## 2015-07-18 ENCOUNTER — Telehealth: Payer: Self-pay | Admitting: Internal Medicine

## 2015-07-18 NOTE — Telephone Encounter (Signed)
Pt advised.

## 2015-07-18 NOTE — Telephone Encounter (Signed)
Received ONO results   - overnight oximetry  Pt does not have significant hypoxemia at night  No need for further evaluation or treatment for other such as sleep apnea  Ok for staff to let pt know

## 2015-07-26 ENCOUNTER — Other Ambulatory Visit (INDEPENDENT_AMBULATORY_CARE_PROVIDER_SITE_OTHER): Payer: BLUE CROSS/BLUE SHIELD

## 2015-07-26 DIAGNOSIS — Z0000 Encounter for general adult medical examination without abnormal findings: Secondary | ICD-10-CM | POA: Diagnosis not present

## 2015-07-26 LAB — COMPREHENSIVE METABOLIC PANEL
ALBUMIN: 4.1 g/dL (ref 3.5–5.2)
ALT: 16 U/L (ref 0–35)
AST: 18 U/L (ref 0–37)
Alkaline Phosphatase: 51 U/L (ref 39–117)
BILIRUBIN TOTAL: 0.5 mg/dL (ref 0.2–1.2)
BUN: 11 mg/dL (ref 6–23)
CHLORIDE: 104 meq/L (ref 96–112)
CO2: 29 meq/L (ref 19–32)
CREATININE: 0.72 mg/dL (ref 0.40–1.20)
Calcium: 9.1 mg/dL (ref 8.4–10.5)
GFR: 93.46 mL/min (ref >60.00)
Glucose, Bld: 87 mg/dL (ref 70–99)
Potassium: 4.3 mEq/L (ref 3.5–5.1)
SODIUM: 137 meq/L (ref 135–145)
Total Protein: 6.5 g/dL (ref 6.0–8.3)

## 2015-07-26 LAB — CBC WITH DIFFERENTIAL/PLATELET
BASOS ABS: 0 10*3/uL (ref 0.0–0.1)
Basophils Relative: 0.3 % (ref 0.0–3.0)
EOS ABS: 0.1 10*3/uL (ref 0.0–0.7)
EOS PCT: 1.6 % (ref 0.0–5.0)
HEMATOCRIT: 40.2 % (ref 36.0–46.0)
HEMOGLOBIN: 14 g/dL (ref 12.0–15.0)
Lymphocytes Relative: 21 % (ref 12.0–46.0)
Lymphs Abs: 1.5 10*3/uL (ref 0.7–4.0)
MCHC: 34.7 g/dL (ref 30.0–36.0)
MCV: 86.5 fl (ref 78.0–100.0)
MONO ABS: 0.3 10*3/uL (ref 0.1–1.0)
MONOS PCT: 4.4 % (ref 3.0–12.0)
Neutro Abs: 5.1 10*3/uL (ref 1.4–7.7)
Neutrophils Relative %: 72.7 % (ref 43.0–77.0)
Platelets: 223 10*3/uL (ref 150.0–400.0)
RBC: 4.65 Mil/uL (ref 3.87–5.11)
RDW: 12 % (ref 11.5–15.5)
WBC: 7 10*3/uL (ref 4.0–10.5)

## 2015-07-26 LAB — LIPID PANEL
CHOL/HDL RATIO: 3
CHOLESTEROL: 152 mg/dL (ref 0–200)
HDL: 52.8 mg/dL (ref >39.00)
LDL CALC: 81 mg/dL (ref 0–99)
NonHDL: 99.51
Triglycerides: 92 mg/dL (ref 0.0–149.0)
VLDL: 18.4 mg/dL (ref 0.0–40.0)

## 2015-07-29 LAB — VITAMIN D 1,25 DIHYDROXY
VITAMIN D 1, 25 (OH) TOTAL: 44 pg/mL (ref 18–72)
Vitamin D2 1, 25 (OH)2: <8 pg/mL
Vitamin D3 1, 25 (OH)2: 44 pg/mL

## 2015-10-07 ENCOUNTER — Telehealth: Payer: Self-pay | Admitting: Internal Medicine

## 2015-10-07 NOTE — Telephone Encounter (Signed)
Pt called request to speak to someone concern about code for the lab work was wong. Please call her back, she has been going around and around to get help,but no luck.

## 2015-10-10 NOTE — Telephone Encounter (Signed)
Left message for patient to call me. 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

## 2015-10-12 NOTE — Telephone Encounter (Signed)
Invoice # is [id number], lab for Vit D test, CPT code is [id number] as of experimental. Please call her back at [phone]

## 2015-10-17 NOTE — Telephone Encounter (Signed)
Confirmed with coding that routine diagnosis z00.00 was used with the Vit D labs. Called [facility], will resubmit claim with z85.850 and E89.0. Called patient and informed her, patient has already paid the bill. Per [employer], if insurance pays then they will refund patient.

## 2017-02-14 LAB — HM MAMMOGRAPHY

## 2017-02-14 LAB — HM PAP SMEAR: HM PAP: NEGATIVE

## 2017-04-04 DIAGNOSIS — H04123 Dry eye syndrome of bilateral lacrimal glands: Secondary | ICD-10-CM | POA: Diagnosis not present

## 2017-06-21 DIAGNOSIS — C73 Malignant neoplasm of thyroid gland: Secondary | ICD-10-CM | POA: Diagnosis not present

## 2017-10-01 DIAGNOSIS — C73 Malignant neoplasm of thyroid gland: Secondary | ICD-10-CM | POA: Diagnosis not present

## 2018-01-28 ENCOUNTER — Encounter: Payer: Self-pay | Admitting: Internal Medicine

## 2018-01-28 NOTE — Patient Instructions (Addendum)
Test(s) ordered today. Your results will be released to [facility] (or called to you) after review, usually within 72hours after test completion. If any changes need to be made, you will be notified at that same time.  All other Health Maintenance issues reviewed.   All recommended immunizations and age-appropriate screenings are up-to-date or discussed.  No immunizations administered today.   Medications reviewed and updated.  No changes recommended at this time.   Please followup in one year   Health Maintenance, Female Adopting a healthy lifestyle and getting preventive care can go a long way to promote health and wellness. Talk with your health care provider about what schedule of regular examinations is right for you. This is a good chance for you to check in with your provider about disease prevention and staying healthy. In between checkups, there are plenty of things you can do on your own. Experts have done a lot of research about which lifestyle changes and preventive measures are most likely to keep you healthy. Ask your health care provider for more information. Weight and diet Eat a healthy diet  Be sure to include plenty of vegetables, fruits, low-fat dairy products, and lean protein.  Do not eat a lot of foods high in solid fats, added sugars, or salt.  Get regular exercise. This is one of the most important things you can do for your health. ? Most adults should exercise for at least 150 minutes each week. The exercise should increase your heart rate and make you sweat (moderate-intensity exercise). ? Most adults should also do strengthening exercises at least twice a week. This is in addition to the moderate-intensity exercise.  Maintain a healthy weight  Body mass index (BMI) is a measurement that can be used to identify possible weight problems. It estimates body fat based on height and weight. Your health care provider can help determine your BMI and help you achieve or  maintain a healthy weight.  For females 64 years of age and older: ? A BMI below 18.5 is considered underweight. ? A BMI of 18.5 to 24.9 is normal. ? A BMI of 25 to 29.9 is considered overweight. ? A BMI of 30 and above is considered obese.  Watch levels of cholesterol and blood lipids  You should start having your blood tested for lipids and cholesterol at 47 years of age, then have this test every 5 years.  You may need to have your cholesterol levels checked more often if: ? Your lipid or cholesterol levels are high. ? You are older than 47 years of age. ? You are at high risk for heart disease.  Cancer screening Lung Cancer  Lung cancer screening is recommended for adults 72-7 years old who are at high risk for lung cancer because of a history of smoking.  A yearly low-dose CT scan of the lungs is recommended for people who: ? Currently smoke. ? Have quit within the past 15 years. ? Have at least a 30-pack-year history of smoking. A pack year is smoking an average of one pack of cigarettes a day for 1 year.  Yearly screening should continue until it has been 15 years since you quit.  Yearly screening should stop if you develop a health problem that would prevent you from having lung cancer treatment.  Breast Cancer  Practice breast self-awareness. This means understanding how your breasts normally appear and feel.  It also means doing regular breast self-exams. Let your health care provider know about any changes,  no matter how small.  If you are in your 20s or 30s, you should have a clinical breast exam (CBE) by a health care provider every 1-3 years as part of a regular health exam.  If you are 40 or older, have a CBE every year. Also consider having a breast X-ray (mammogram) every year.  If you have a family history of breast cancer, talk to your health care provider about genetic screening.  If you are at high risk for breast cancer, talk to your health care  provider about having an MRI and a mammogram every year.  Breast cancer gene (BRCA) assessment is recommended for women who have family members with BRCA-related cancers. BRCA-related cancers include: ? Breast. ? Ovarian. ? Tubal. ? Peritoneal cancers.  Results of the assessment will determine the need for genetic counseling and BRCA1 and BRCA2 testing.  Cervical Cancer Your health care provider may recommend that you be screened regularly for cancer of the pelvic organs (ovaries, uterus, and vagina). This screening involves a pelvic examination, including checking for microscopic changes to the surface of your cervix (Pap test). You may be encouraged to have this screening done every 3 years, beginning at age 21.  For women ages 30-65, health care providers may recommend pelvic exams and Pap testing every 3 years, or they may recommend the Pap and pelvic exam, combined with testing for human papilloma virus (HPV), every 5 years. Some types of HPV increase your risk of cervical cancer. Testing for HPV may also be done on women of any age with unclear Pap test results.  Other health care providers may not recommend any screening for nonpregnant women who are considered low risk for pelvic cancer and who do not have symptoms. Ask your health care provider if a screening pelvic exam is right for you.  If you have had past treatment for cervical cancer or a condition that could lead to cancer, you need Pap tests and screening for cancer for at least 20 years after your treatment. If Pap tests have been discontinued, your risk factors (such as having a new sexual partner) need to be reassessed to determine if screening should resume. Some women have medical problems that increase the chance of getting cervical cancer. In these cases, your health care provider may recommend more frequent screening and Pap tests.  Colorectal Cancer  This type of cancer can be detected and often prevented.  Routine  colorectal cancer screening usually begins at 47 years of age and continues through 47 years of age.  Your health care provider may recommend screening at an earlier age if you have risk factors for colon cancer.  Your health care provider may also recommend using home test kits to check for hidden blood in the stool.  A small camera at the end of a tube can be used to examine your colon directly (sigmoidoscopy or colonoscopy). This is done to check for the earliest forms of colorectal cancer.  Routine screening usually begins at age 50.  Direct examination of the colon should be repeated every 5-10 years through 47 years of age. However, you may need to be screened more often if early forms of precancerous polyps or small growths are found.  Skin Cancer  Check your skin from head to toe regularly.  Tell your health care provider about any new moles or changes in moles, especially if there is a change in a mole's shape or color.  Also tell your health care provider if you   have a mole that is larger than the size of a pencil eraser.  Always use sunscreen. Apply sunscreen liberally and repeatedly throughout the day.  Protect yourself by wearing long sleeves, pants, a wide-brimmed hat, and sunglasses whenever you are outside.  Heart disease, diabetes, and high blood pressure  High blood pressure causes heart disease and increases the risk of stroke. High blood pressure is more likely to develop in: ? People who have blood pressure in the high end of the normal range (130-139/85-89 mm Hg). ? People who are overweight or obese. ? People who are African American.  If you are 24-25 years of age, have your blood pressure checked every 3-5 years. If you are 2 years of age or older, have your blood pressure checked every year. You should have your blood pressure measured twice-once when you are at a hospital or clinic, and once when you are not at a hospital or clinic. Record the average of the  two measurements. To check your blood pressure when you are not at a hospital or clinic, you can use: ? An automated blood pressure machine at a pharmacy. ? A home blood pressure monitor.  If you are between 42 years and 59 years old, ask your health care provider if you should take aspirin to prevent strokes.  Have regular diabetes screenings. This involves taking a blood sample to check your fasting blood sugar level. ? If you are at a normal weight and have a low risk for diabetes, have this test once every three years after 47 years of age. ? If you are overweight and have a high risk for diabetes, consider being tested at a younger age or more often. Preventing infection Hepatitis B  If you have a higher risk for hepatitis B, you should be screened for this virus. You are considered at high risk for hepatitis B if: ? You were born in a country where hepatitis B is common. Ask your health care provider which countries are considered high risk. ? Your parents were born in a high-risk country, and you have not been immunized against hepatitis B (hepatitis B vaccine). ? You have HIV or AIDS. ? You use needles to inject street drugs. ? You live with someone who has hepatitis B. ? You have had sex with someone who has hepatitis B. ? You get hemodialysis treatment. ? You take certain medicines for conditions, including cancer, organ transplantation, and autoimmune conditions.  Hepatitis C  Blood testing is recommended for: ? Everyone born from [birth year] through 1965. ? Anyone with known risk factors for hepatitis C.  Sexually transmitted infections (STIs)  You should be screened for sexually transmitted infections (STIs) including gonorrhea and chlamydia if: ? You are sexually active and are younger than 47 years of age. ? You are older than 47 years of age and your health care provider tells you that you are at risk for this type of infection. ? Your sexual activity has changed since you  were last screened and you are at an increased risk for chlamydia or gonorrhea. Ask your health care provider if you are at risk.  If you do not have HIV, but are at risk, it may be recommended that you take a prescription medicine daily to prevent HIV infection. This is called pre-exposure prophylaxis (PrEP). You are considered at risk if: ? You are sexually active and do not regularly use condoms or know the HIV status of your partner(s). ? You take drugs by injection. ?  You are sexually active with a partner who has HIV.  Talk with your health care provider about whether you are at high risk of being infected with HIV. If you choose to begin PrEP, you should first be tested for HIV. You should then be tested every 3 months for as long as you are taking PrEP. Pregnancy  If you are premenopausal and you may become pregnant, ask your health care provider about preconception counseling.  If you may become pregnant, take 400 to 800 micrograms (mcg) of folic acid every day.  If you want to prevent pregnancy, talk to your health care provider about birth control (contraception). Osteoporosis and menopause  Osteoporosis is a disease in which the bones lose minerals and strength with aging. This can result in serious bone fractures. Your risk for osteoporosis can be identified using a bone density scan.  If you are 65 years of age or older, or if you are at risk for osteoporosis and fractures, ask your health care provider if you should be screened.  Ask your health care provider whether you should take a calcium or vitamin D supplement to lower your risk for osteoporosis.  Menopause may have certain physical symptoms and risks.  Hormone replacement therapy may reduce some of these symptoms and risks. Talk to your health care provider about whether hormone replacement therapy is right for you. Follow these instructions at home:  Schedule regular health, dental, and eye exams.  Stay current  with your immunizations.  Do not use any tobacco products including cigarettes, chewing tobacco, or electronic cigarettes.  If you are pregnant, do not drink alcohol.  If you are breastfeeding, limit how much and how often you drink alcohol.  Limit alcohol intake to no more than 1 drink per day for nonpregnant women. One drink equals 12 ounces of beer, 5 ounces of wine, or 1 ounces of hard liquor.  Do not use street drugs.  Do not share needles.  Ask your health care provider for help if you need support or information about quitting drugs.  Tell your health care provider if you often feel depressed.  Tell your health care provider if you have ever been abused or do not feel safe at home. This information is not intended to replace advice given to you by your health care provider. Make sure you discuss any questions you have with your health care provider. Document Released: 12/04/2010 Document Revised: 10/27/2015 Document Reviewed: 02/22/2015 Elsevier Interactive Patient Education  2018 [employer] Inc[employer]  

## 2018-01-28 NOTE — Progress Notes (Signed)
Subjective:    Patient ID: Destiny Douglas, female    DOB: 1970-08-15, 47 y.o.   MRN: [id number]  HPI She is here for a physical exam.   She denies any changes in her health.  She does have intermittent itching from external hemorrhoids and uses otc hydrocortisone cream, which works.  She denies pain or bleeding.   She has no concerns.   Medications and allergies reviewed with patient and updated if appropriate.  Patient Active Problem List   Diagnosis Date Noted  . External hemorrhoids 01/29/2018  . Bradycardia 06/07/2015  . THYROID CANCER 01/11/2010  . Hypothyroidism 02/07/2007    Current Outpatient Medications on File Prior to Visit  Medication Sig Dispense Refill  . Calcium 500-125 MG-UNIT TABS Take by mouth. Take two by mouth daily.    Marland Kitchen levothyroxine (SYNTHROID) 100 MCG tablet Take 1 tablet twice a week on an empty stomach    . levothyroxine (SYNTHROID, LEVOTHROID) 88 MCG tablet Take 88 mcg by mouth daily. Take 1 six days a week    . liothyronine (CYTOMEL) 5 MCG tablet Take 2.5 mcg by mouth daily.    . Multiple Vitamin (MULTIVITAMIN) tablet Take 1 tablet by mouth.     No current facility-administered medications on file prior to visit.     Past Medical History:  Diagnosis Date  . Anxiety state, unspecified   . BREAST CYST, LEFT 08/28/2007  . Cellulitis of ear 10/02/2014  . HYPOTHYROIDISM    post ablation for malignancy  . INSOMNIA   . OSTEOPENIA    Pt. denies any bone isssues.  Has had normal bone density tests  . RHINITIS   . THYROID CANCER 2004   s/p ablation    Past Surgical History:  Procedure Laterality Date  . BREAST SURGERY     breast biopsy, right  . THYROIDECTOMY  2004   1/2 2002 and the other half 2004    Social History   Socioeconomic History  . Marital status: Married    Spouse name: Not on file  . Number of children: Not on file  . Years of education: Not on file  . Highest education level: Not on file  Occupational History  . Not  on file  Social Needs  . Financial resource strain: Not on file  . Food insecurity:    Worry: Not on file    Inability: Not on file  . Transportation needs:    Medical: Not on file    Non-medical: Not on file  Tobacco Use  . Smoking status: Never Smoker  . Smokeless tobacco: Never Used  . Tobacco comment: Married, lives with husband and their son. Originally from [place]; undergraduate at [employer] and [employer]  Substance and Sexual Activity  . Alcohol use: Yes    Comment: occasional glass of wine  . Drug use: No  . Sexual activity: Yes    Birth control/protection: Condom  Lifestyle  . Physical activity:    Days per week: Not on file    Minutes per session: Not on file  . Stress: Not on file  Relationships  . Social connections:    Talks on phone: Not on file    Gets together: Not on file    Attends religious service: Not on file    Active member of club or organization: Not on file    Attends meetings of clubs or organizations: Not on file    Relationship status: Not on file  Other  Topics Concern  . Not on file  Social History Narrative   Running regularly for exercise    Family History  Problem Relation Age of Onset  . Breast cancer Mother   . Coronary artery disease Father   . Hyperlipidemia Father   . Hypertension Father   . Hypothyroidism Sister     Review of Systems  Constitutional: Negative for chills and fever.  Eyes: Negative for visual disturbance.  Respiratory: Negative for cough, shortness of breath and wheezing.   Cardiovascular: Negative for chest pain, palpitations and leg swelling.  Gastrointestinal: Negative for abdominal pain, anal bleeding (hemorrhoidal itching intermittent), blood in stool, constipation, diarrhea and nausea.       No gerd  Genitourinary: Negative for dysuria and hematuria.  Musculoskeletal: Positive for arthralgias (occ). Negative for back pain.  Skin: Negative for color change and rash.  Neurological:  Negative for light-headedness and headaches.  Psychiatric/Behavioral: Negative for dysphoric mood. The patient is not nervous/anxious.        Objective:   Vitals:   01/29/18 0757  BP: 110/70  Pulse: 70  Resp: 16  Temp: 98.5 F (36.9 C)  SpO2: 98%   Filed Weights   01/29/18 0757  Weight: 95 lb 6.4 oz (43.3 kg)   Body mass index is 19.94 kg/m.  Wt Readings from Last 3 Encounters:  01/29/18 95 lb 6.4 oz (43.3 kg)  07/04/15 100 lb (45.4 kg)  06/07/15 97 lb (44 kg)     Physical Exam Constitutional: She appears well-developed and well-nourished. No distress.  HENT:  Head: Normocephalic and atraumatic.  Right Ear: External ear normal. Normal ear canal and TM Left Ear: External ear normal.  Normal ear canal and TM Mouth/Throat: Oropharynx is clear and moist.  Eyes: Conjunctivae and EOM are normal.  Neck: Neck supple. No tracheal deviation present. No thyromegaly present.  No carotid bruit  Cardiovascular: Normal rate, regular rhythm and normal heart sounds.   No murmur heard.  No edema. Pulmonary/Chest: Effort normal and breath sounds normal. No respiratory distress. She has no wheezes. She has no rales.  Breast: deferred to Gyn Abdominal: Soft. She exhibits no distension. There is no tenderness.  Rectal: deferred Lymphadenopathy: She has no cervical adenopathy.  Skin: Skin is warm and dry. She is not diaphoretic.  Psychiatric: She has a normal mood and affect. Her behavior is normal.        Assessment & Plan:   Physical exam: Screening blood work  ordered Immunizations   Td up to date Mammogram   Up to date  Gyn   Up to date  - Dr Garwin [facility] exams   Up to date  Exercise  Regular, zumba Weight  Normal BMI Skin no concerns, - discussed monitor for changes in moles and see derm if needed Substance abuse   none  See Problem List for Assessment and Plan of chronic medical problems.   FU in 1-2 years

## 2018-01-29 ENCOUNTER — Ambulatory Visit (INDEPENDENT_AMBULATORY_CARE_PROVIDER_SITE_OTHER): Payer: BLUE CROSS/BLUE SHIELD | Admitting: Internal Medicine

## 2018-01-29 ENCOUNTER — Other Ambulatory Visit (INDEPENDENT_AMBULATORY_CARE_PROVIDER_SITE_OTHER): Payer: BLUE CROSS/BLUE SHIELD

## 2018-01-29 ENCOUNTER — Encounter: Payer: Self-pay | Admitting: Internal Medicine

## 2018-01-29 VITALS — BP 110/70 | HR 70 | Temp 98.5°F | Resp 16 | Ht <= 58 in | Wt 95.4 lb

## 2018-01-29 DIAGNOSIS — K644 Residual hemorrhoidal skin tags: Secondary | ICD-10-CM

## 2018-01-29 DIAGNOSIS — E890 Postprocedural hypothyroidism: Secondary | ICD-10-CM | POA: Diagnosis not present

## 2018-01-29 DIAGNOSIS — Z0000 Encounter for general adult medical examination without abnormal findings: Secondary | ICD-10-CM

## 2018-01-29 DIAGNOSIS — C73 Malignant neoplasm of thyroid gland: Secondary | ICD-10-CM

## 2018-01-29 LAB — CBC WITH DIFFERENTIAL/PLATELET
BASOS PCT: 0.4 % (ref 0.0–3.0)
Basophils Absolute: 0 10*3/uL (ref 0.0–0.1)
EOS PCT: 2.8 % (ref 0.0–5.0)
Eosinophils Absolute: 0.2 10*3/uL (ref 0.0–0.7)
HEMATOCRIT: 43.4 % (ref 36.0–46.0)
HEMOGLOBIN: 15 g/dL (ref 12.0–15.0)
LYMPHS PCT: 22.8 % (ref 12.0–46.0)
Lymphs Abs: 1.8 10*3/uL (ref 0.7–4.0)
MCHC: 34.5 g/dL (ref 30.0–36.0)
MCV: 86.3 fl (ref 78.0–100.0)
MONO ABS: 0.5 10*3/uL (ref 0.1–1.0)
Monocytes Relative: 6.4 % (ref 3.0–12.0)
Neutro Abs: 5.3 10*3/uL (ref 1.4–7.7)
Neutrophils Relative %: 67.6 % (ref 43.0–77.0)
Platelets: 230 10*3/uL (ref 150.0–400.0)
RBC: 5.03 Mil/uL (ref 3.87–5.11)
RDW: 12 % (ref 11.5–15.5)
WBC: 7.8 10*3/uL (ref 4.0–10.5)

## 2018-01-29 LAB — COMPREHENSIVE METABOLIC PANEL
ALBUMIN: 4.3 g/dL (ref 3.5–5.2)
ALK PHOS: 57 U/L (ref 39–117)
ALT: 14 U/L (ref 0–35)
AST: 16 U/L (ref 0–37)
BUN: 10 mg/dL (ref 6–23)
CALCIUM: 9.5 mg/dL (ref 8.4–10.5)
CHLORIDE: 102 meq/L (ref 96–112)
CO2: 26 mEq/L (ref 19–32)
Creatinine, Ser: 0.78 mg/dL (ref 0.40–1.20)
GFR: 84.26 mL/min (ref >60.00)
Glucose, Bld: 87 mg/dL (ref 70–99)
POTASSIUM: 4.1 meq/L (ref 3.5–5.1)
SODIUM: 138 meq/L (ref 135–145)
TOTAL PROTEIN: 6.9 g/dL (ref 6.0–8.3)
Total Bilirubin: 0.7 mg/dL (ref 0.2–1.2)

## 2018-01-29 LAB — LIPID PANEL
CHOLESTEROL: 155 mg/dL (ref 0–200)
HDL: 56.5 mg/dL (ref >39.00)
LDL CALC: 77 mg/dL (ref 0–99)
NonHDL: 98.52
TRIGLYCERIDES: 108 mg/dL (ref 0.0–149.0)
Total CHOL/HDL Ratio: 3
VLDL: 21.6 mg/dL (ref 0.0–40.0)

## 2018-01-29 NOTE — Assessment & Plan Note (Signed)
Mild - itching intermittently only Using otc hydrocortisone cream prn - effective Deferred prescription medication Will discuss further with gyn

## 2018-01-29 NOTE — Assessment & Plan Note (Signed)
Management per endocrine - sees them 2/year

## 2018-01-29 NOTE — Assessment & Plan Note (Signed)
Follows with endo - sees them 2/year

## 2018-01-31 DIAGNOSIS — C73 Malignant neoplasm of thyroid gland: Secondary | ICD-10-CM | POA: Diagnosis not present

## 2018-02-07 ENCOUNTER — Encounter: Payer: Self-pay | Admitting: Internal Medicine

## 2018-05-12 DIAGNOSIS — C73 Malignant neoplasm of thyroid gland: Secondary | ICD-10-CM | POA: Diagnosis not present

## 2018-06-19 NOTE — Progress Notes (Signed)
Subjective:    Patient ID: Destiny Douglas, female    DOB: 1971-03-13, 48 y.o.   MRN: [id number]  HPI The patient is here for an acute visit.   Shortness of breath, tingling in fingers and hand:  Her symptoms started about 3 weeks ago.  The SOB comes in the middle of the night and has been associated with chest discomfort.  She started having numbness in her left lateral hand and left leg/foot.  She has had SOB and tingling/numbness in the past with the thyroid.  This numbness/tingling and chest discomfort is different.  She follows with endocrine for her thyroid and saw him last month.  Her blood work at that time was okay..    She has also had some stomach upset and she is lactose intolerant- she does not eat any lactose.  She has had some upper abdominal discomfort and bloating, but no diarrhea or nausea.  She has been getting hotflashes and in the middle of the night she has itching in her feet which is associated with a hot flashes and them in the hot flashes go away the itching stops.  She has never had that before.  She was unsure of all this was related to the thyroid or something else.  She was worried about her heart.  Medications and allergies reviewed with patient and updated if appropriate.  Patient Active Problem List   Diagnosis Date Noted  . External hemorrhoids 01/29/2018  . Bradycardia 06/07/2015  . THYROID CANCER 01/11/2010  . Hypothyroidism 02/07/2007    Current Outpatient Medications on File Prior to Visit  Medication Sig Dispense Refill  . Calcium 500-125 MG-UNIT TABS Take by mouth. Take two by mouth daily.    Marland Kitchen levothyroxine (SYNTHROID) 100 MCG tablet Take 1 tablet twice a week on an empty stomach    . levothyroxine (SYNTHROID, LEVOTHROID) 88 MCG tablet Take 88 mcg by mouth daily. Take 1 six days a week    . liothyronine (CYTOMEL) 5 MCG tablet Take 2.5 mcg by mouth daily.    . Multiple Vitamin (MULTIVITAMIN) tablet Take 1 tablet by mouth.     No current  facility-administered medications on file prior to visit.     Past Medical History:  Diagnosis Date  . Anxiety state, unspecified   . BREAST CYST, LEFT 08/28/2007  . Cellulitis of ear 10/02/2014  . HYPOTHYROIDISM    post ablation for malignancy  . INSOMNIA   . OSTEOPENIA    Pt. denies any bone isssues.  Has had normal bone density tests  . RHINITIS   . THYROID CANCER 2004   s/p ablation    Past Surgical History:  Procedure Laterality Date  . BREAST SURGERY     breast biopsy, right  . THYROIDECTOMY  2004   1/2 2002 and the other half 2004    Social History   Socioeconomic History  . Marital status: Married    Spouse name: Not on file  . Number of children: Not on file  . Years of education: Not on file  . Highest education level: Not on file  Occupational History  . Not on file  Social Needs  . Financial resource strain: Not on file  . Food insecurity:    Worry: Not on file    Inability: Not on file  . Transportation needs:    Medical: Not on file    Non-medical: Not on file  Tobacco Use  . Smoking status: Never Smoker  . Smokeless tobacco:  Never Used  . Tobacco comment: Married, lives with husband and their son. Originally from [place]; undergraduate at [employer] and [employer]  Substance and Sexual Activity  . Alcohol use: Yes    Comment: occasional glass of wine  . Drug use: No  . Sexual activity: Yes    Birth control/protection: Condom  Lifestyle  . Physical activity:    Days per week: Not on file    Minutes per session: Not on file  . Stress: Not on file  Relationships  . Social connections:    Talks on phone: Not on file    Gets together: Not on file    Attends religious service: Not on file    Active member of club or organization: Not on file    Attends meetings of clubs or organizations: Not on file    Relationship status: Not on file  Other Topics Concern  . Not on file  Social History Narrative   Running regularly for  exercise    Family History  Problem Relation Age of Onset  . Breast cancer Mother   . Coronary artery disease Father   . Hyperlipidemia Father   . Hypertension Father   . Hypothyroidism Sister     Review of Systems  Constitutional: Negative for chills, fatigue and fever.       Hot flashes  Respiratory: Positive for shortness of breath. Negative for cough and wheezing.   Cardiovascular: Positive for chest pain and palpitations (rare). Negative for leg swelling.  Gastrointestinal: Positive for abdominal distention (bloating and gas). Negative for abdominal pain, blood in stool, constipation, diarrhea and nausea.       No gerd  Genitourinary: Negative for dysuria and hematuria.  Musculoskeletal: Positive for arthralgias and myalgias (mild).  Skin:       Itching in feet with hot flashes  Neurological: Positive for weakness (weakness, N/T, feels swollen - hand, foot) and numbness.  Psychiatric/Behavioral: Negative for dysphoric mood. The patient is not nervous/anxious.        Objective:   Vitals:   06/20/18 1258  BP: 140/78  Pulse: 62  Resp: 16  Temp: 97.9 F (36.6 C)  SpO2: 99%   BP Readings from Last 3 Encounters:  06/20/18 140/78  01/29/18 110/70  07/04/15 110/64   Wt Readings from Last 3 Encounters:  06/20/18 99 lb 6.4 oz (45.1 kg)  01/29/18 95 lb 6.4 oz (43.3 kg)  07/04/15 100 lb (45.4 kg)   Body mass index is 20.77 kg/m.   Physical Exam Constitutional:      General: She is not in acute distress.    Appearance: She is well-developed. She is not ill-appearing.  HENT:     Head: Normocephalic and atraumatic.  Neck:     Musculoskeletal: Neck supple.     Thyroid: No thyromegaly.  Cardiovascular:     Rate and Rhythm: Normal rate and regular rhythm.     Heart sounds: No murmur.  Pulmonary:     Effort: Pulmonary effort is normal. No tachypnea or respiratory distress.     Breath sounds: Normal breath sounds. No wheezing or rhonchi.  Chest:     Chest wall: No  tenderness (No tenderness in left upper chest with palpation).  Musculoskeletal:     Right lower leg: No edema.  Lymphadenopathy:     Cervical: No cervical adenopathy.  Skin:    General: Skin is warm and dry.  Neurological:     Mental Status: She is alert.  Assessment & Plan:    See Problem List for Assessment and Plan of chronic medical problems.

## 2018-06-20 ENCOUNTER — Encounter: Payer: Self-pay | Admitting: Internal Medicine

## 2018-06-20 ENCOUNTER — Ambulatory Visit: Payer: BLUE CROSS/BLUE SHIELD | Admitting: Internal Medicine

## 2018-06-20 ENCOUNTER — Ambulatory Visit (INDEPENDENT_AMBULATORY_CARE_PROVIDER_SITE_OTHER)
Admission: RE | Admit: 2018-06-20 | Discharge: 2018-06-20 | Disposition: A | Discharge status: Home / Self Care | Payer: BLUE CROSS/BLUE SHIELD | Source: Ambulatory Visit | Attending: Internal Medicine | Admitting: Internal Medicine

## 2018-06-20 ENCOUNTER — Other Ambulatory Visit: Payer: BLUE CROSS/BLUE SHIELD

## 2018-06-20 ENCOUNTER — Other Ambulatory Visit (INDEPENDENT_AMBULATORY_CARE_PROVIDER_SITE_OTHER): Payer: BLUE CROSS/BLUE SHIELD

## 2018-06-20 VITALS — BP 140/78 | HR 62 | Temp 97.9°F | Resp 16 | Ht <= 58 in | Wt 99.4 lb

## 2018-06-20 DIAGNOSIS — M2550 Pain in unspecified joint: Secondary | ICD-10-CM

## 2018-06-20 DIAGNOSIS — E890 Postprocedural hypothyroidism: Secondary | ICD-10-CM

## 2018-06-20 DIAGNOSIS — R0602 Shortness of breath: Secondary | ICD-10-CM

## 2018-06-20 DIAGNOSIS — R0789 Other chest pain: Secondary | ICD-10-CM

## 2018-06-20 DIAGNOSIS — R079 Chest pain, unspecified: Secondary | ICD-10-CM | POA: Diagnosis not present

## 2018-06-20 LAB — CBC WITH DIFFERENTIAL/PLATELET
Basophils Absolute: 0 10*3/uL (ref 0.0–0.1)
Basophils Relative: 0.5 % (ref 0.0–3.0)
Eosinophils Absolute: 0.3 10*3/uL (ref 0.0–0.7)
Eosinophils Relative: 4.5 % (ref 0.0–5.0)
HCT: 42.8 % (ref 36.0–46.0)
Hemoglobin: 15 g/dL (ref 12.0–15.0)
Lymphocytes Relative: 31.5 % (ref 12.0–46.0)
Lymphs Abs: 2 10*3/uL (ref 0.7–4.0)
MCHC: 35.1 g/dL (ref 30.0–36.0)
MCV: 86.3 fl (ref 78.0–100.0)
Monocytes Absolute: 0.4 10*3/uL (ref 0.1–1.0)
Monocytes Relative: 6.1 % (ref 3.0–12.0)
NEUTROS PCT: 57.4 % (ref 43.0–77.0)
Neutro Abs: 3.7 10*3/uL (ref 1.4–7.7)
Platelets: 259 10*3/uL (ref 150.0–400.0)
RBC: 4.95 Mil/uL (ref 3.87–5.11)
RDW: 12.2 % (ref 11.5–15.5)
WBC: 6.5 10*3/uL (ref 4.0–10.5)

## 2018-06-20 LAB — COMPREHENSIVE METABOLIC PANEL
ALT: 19 U/L (ref 0–35)
AST: 18 U/L (ref 0–37)
Albumin: 4.6 g/dL (ref 3.5–5.2)
Alkaline Phosphatase: 73 U/L (ref 39–117)
BUN: 14 mg/dL (ref 6–23)
CO2: 29 mEq/L (ref 19–32)
Calcium: 9.9 mg/dL (ref 8.4–10.5)
Chloride: 102 mEq/L (ref 96–112)
Creatinine, Ser: 0.78 mg/dL (ref 0.40–1.20)
GFR: 79.14 mL/min (ref >60.00)
Glucose, Bld: 98 mg/dL (ref 70–99)
Potassium: 4.1 mEq/L (ref 3.5–5.1)
Sodium: 141 mEq/L (ref 135–145)
Total Bilirubin: 0.4 mg/dL (ref 0.2–1.2)
Total Protein: 7.4 g/dL (ref 6.0–8.3)

## 2018-06-20 LAB — C-REACTIVE PROTEIN: CRP: 0.3 mg/dL — ABNORMAL LOW (ref 0.5–20.0)

## 2018-06-20 LAB — NO SPECIMEN RECEIVED: Tests (Ordered): 249

## 2018-06-20 LAB — T3, FREE: T3, Free: 3.4 pg/mL (ref 2.3–4.2)

## 2018-06-20 LAB — TSH: TSH: <0.01 u[IU]/mL — ABNORMAL LOW (ref 0.35–4.50)

## 2018-06-20 LAB — T4, FREE: FREE T4: 1.12 ng/dL (ref 0.60–1.60)

## 2018-06-20 LAB — SEDIMENTATION RATE: Sed Rate: 7 mm/hr (ref 0–20)

## 2018-06-20 NOTE — Assessment & Plan Note (Signed)
Experiencing shortness of breath for the past 2 weeks Has had similar shortness of breath with thyroid dysfunction Saw her endocrinologist last month and thyroid was in the normal range, but will recheck-TSH, free T4, free T3 Anemia is a possibility-CBC We will get a chest x-ray No cold symptoms D-dimer to rule out PE, which seems less likely as well Denies anxiety

## 2018-06-20 NOTE — Assessment & Plan Note (Signed)
She has had increased muscle pain and joint pain Possibly thyroid Will check autoimmune blood work including ANA, CRP, ESR, but autoimmune disease seems less likely

## 2018-06-20 NOTE — Assessment & Plan Note (Signed)
Following with endocrine-seen last month and blood work was normal Having a conglomeration of symptoms that could possibly be thyroid in nature Will check TSH, free T4, free T3-she can follow-up with her endocrinologist if her medication needs to be adjusted

## 2018-06-20 NOTE — Patient Instructions (Signed)
  Tests ordered today. Your results will be released to MyChart (or called to you) after review, usually within 72hours after test completion. If any changes need to be made, you will be notified at that same time.  Medications reviewed and updated.  Changes include :   none     

## 2018-06-20 NOTE — Assessment & Plan Note (Signed)
She is having some left-sided chest discomfort EKG today-sinus bradycardia at 57 bpm, negative precordial T waves; no acute change.  No change compared to previous EKG from 2013 Chest pain unlikely cardiac in nature given normal EKG-currently having pain and pain has been intermittent for at least couple of weeks Possibly anxiety related, thyroid related, musculoskeletal, less likely PE given normal saturation and bradycardia along with normal EKG CBC, CMP, d-dimer

## 2018-06-24 LAB — ANTI-NUCLEAR AB-TITER (ANA TITER)

## 2018-06-24 LAB — D-DIMER, QUANTITATIVE: D-Dimer, Quant: 0.4 mcg/mL FEU (ref <0.50)

## 2018-06-24 LAB — ANA: Anti Nuclear Antibody(ANA): POSITIVE — AB

## 2018-08-18 DIAGNOSIS — C73 Malignant neoplasm of thyroid gland: Secondary | ICD-10-CM | POA: Diagnosis not present

## 2018-12-01 DIAGNOSIS — C73 Malignant neoplasm of thyroid gland: Secondary | ICD-10-CM | POA: Diagnosis not present

## 2019-01-27 DIAGNOSIS — D2272 Melanocytic nevi of left lower limb, including hip: Secondary | ICD-10-CM | POA: Diagnosis not present

## 2019-01-27 DIAGNOSIS — L918 Other hypertrophic disorders of the skin: Secondary | ICD-10-CM | POA: Diagnosis not present

## 2019-01-27 DIAGNOSIS — D2261 Melanocytic nevi of right upper limb, including shoulder: Secondary | ICD-10-CM | POA: Diagnosis not present

## 2019-01-27 DIAGNOSIS — D225 Melanocytic nevi of trunk: Secondary | ICD-10-CM | POA: Diagnosis not present

## 2019-01-27 IMAGING — DX DG CHEST 2V
2 series · 2 of 2 positions shown · non-contrast
Comparison: 08/11/2008

CLINICAL DATA: Upper chest pain and dyspnea intermittently for 3
weeks.

EXAM:
CHEST - 2 VIEW

[chest pa]
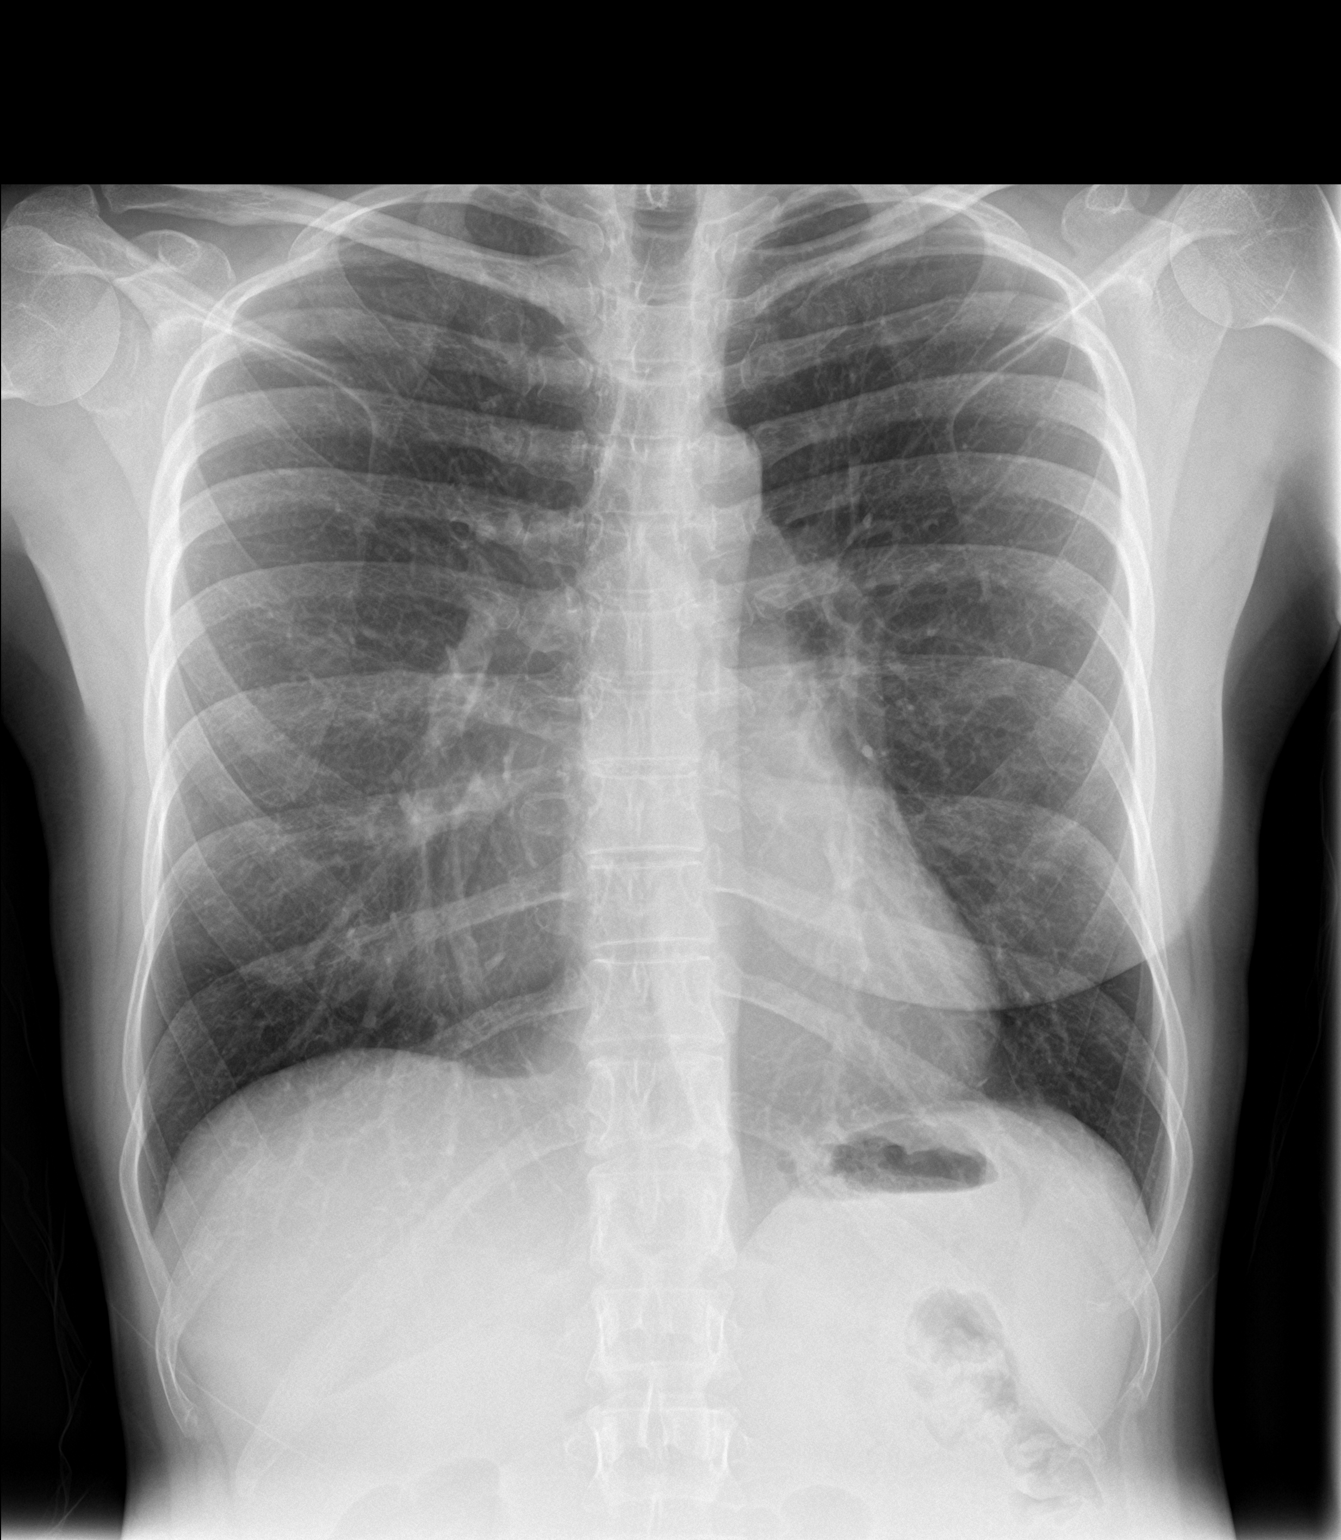

[chest lat]
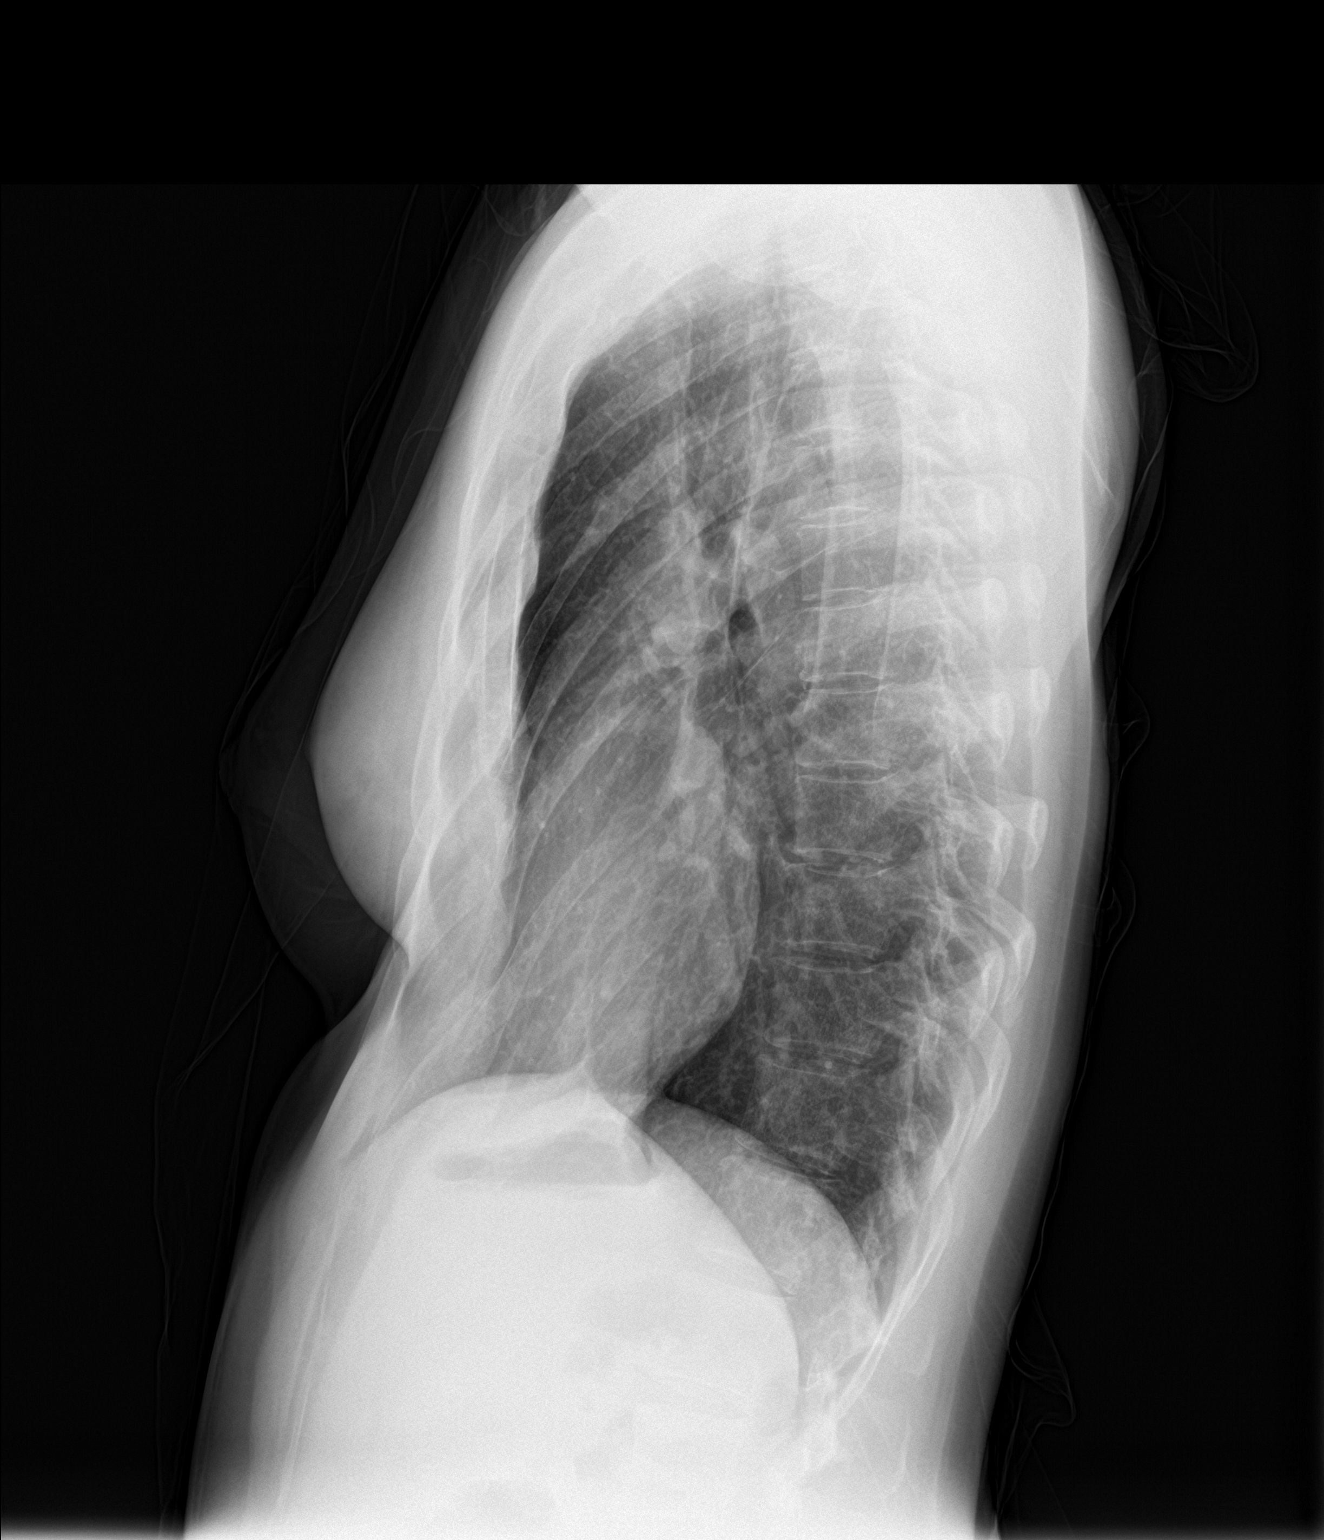

[2 of 2 positions shown; findings below may reference images not displayed]

FINDINGS: Heart, mediastinum and hila are unremarkable. The lungs are clear.
No pleural effusion or pneumothorax.

Pectus excavatum, stable.  Skeletal structures are intact.
IMPRESSION: No active cardiopulmonary disease.

## 2019-03-16 DIAGNOSIS — Z01419 Encounter for gynecological examination (general) (routine) without abnormal findings: Secondary | ICD-10-CM | POA: Diagnosis not present

## 2019-03-16 DIAGNOSIS — Z1231 Encounter for screening mammogram for malignant neoplasm of breast: Secondary | ICD-10-CM | POA: Diagnosis not present

## 2019-03-16 DIAGNOSIS — N841 Polyp of cervix uteri: Secondary | ICD-10-CM | POA: Diagnosis not present

## 2019-03-16 DIAGNOSIS — Z23 Encounter for immunization: Secondary | ICD-10-CM | POA: Diagnosis not present

## 2019-03-16 DIAGNOSIS — Z1151 Encounter for screening for human papillomavirus (HPV): Secondary | ICD-10-CM | POA: Diagnosis not present

## 2019-03-16 DIAGNOSIS — Z6820 Body mass index (BMI) 20.0-20.9, adult: Secondary | ICD-10-CM | POA: Diagnosis not present

## 2019-03-24 DIAGNOSIS — Z13220 Encounter for screening for lipoid disorders: Secondary | ICD-10-CM | POA: Diagnosis not present

## 2019-03-24 DIAGNOSIS — Z131 Encounter for screening for diabetes mellitus: Secondary | ICD-10-CM | POA: Diagnosis not present

## 2019-03-24 DIAGNOSIS — Z0000 Encounter for general adult medical examination without abnormal findings: Secondary | ICD-10-CM | POA: Diagnosis not present

## 2019-03-24 DIAGNOSIS — Z130 Encounter for screening for diseases of the blood and blood-forming organs and certain disorders involving the immune mechanism: Secondary | ICD-10-CM | POA: Diagnosis not present

## 2019-04-02 DIAGNOSIS — C73 Malignant neoplasm of thyroid gland: Secondary | ICD-10-CM | POA: Diagnosis not present

## 2019-06-26 ENCOUNTER — Ambulatory Visit
Admission: RE | Admit: 2019-06-26 | Discharge: 2019-06-26 | Disposition: A | Discharge status: Home / Self Care | Payer: 59 | Source: Ambulatory Visit | Attending: Orthopedic Surgery | Admitting: Orthopedic Surgery

## 2019-06-26 ENCOUNTER — Other Ambulatory Visit: Payer: Self-pay

## 2019-06-26 ENCOUNTER — Other Ambulatory Visit: Payer: Self-pay | Admitting: Orthopedic Surgery

## 2019-06-26 DIAGNOSIS — S32810A Multiple fractures of pelvis with stable disruption of pelvic ring, initial encounter for closed fracture: Secondary | ICD-10-CM

## 2019-06-26 DIAGNOSIS — R102 Pelvic and perineal pain: Secondary | ICD-10-CM

## 2019-08-21 ENCOUNTER — Ambulatory Visit: Payer: 59 | Attending: Internal Medicine

## 2019-08-21 DIAGNOSIS — Z23 Encounter for immunization: Secondary | ICD-10-CM

## 2019-08-21 NOTE — Progress Notes (Signed)
   Covid-19 Vaccination Clinic  Name:  Destiny Douglas    MRN: [id number] DOB: 1971-05-15  08/21/2019  [name] was observed post Covid-19 immunization for 15 minutes without incident. She was provided with Vaccine Information Sheet and instruction to access the V-Safe system.   [name] was instructed to call 911 with any severe reactions post vaccine: Marland Kitchen Difficulty breathing  . Swelling of face and throat  . A fast heartbeat  . A bad rash all over body  . Dizziness and weakness   Immunizations Administered    Name Date Dose VIS Date Route   Pfizer COVID-19 Vaccine 08/21/2019  8:32 AM 0.3 mL 05/15/2019 Intramuscular   Manufacturer: [place]   Lot: [id number]   [place]: [id number]

## 2019-09-15 ENCOUNTER — Ambulatory Visit: Payer: 59 | Attending: Internal Medicine

## 2019-09-15 DIAGNOSIS — Z23 Encounter for immunization: Secondary | ICD-10-CM

## 2019-09-15 NOTE — Progress Notes (Signed)
   Covid-19 Vaccination Clinic  Name:  [name]    MRN: [id number] DOB: 1970/10/31  09/15/2019  [name] was observed post Covid-19 immunization for 15 minutes without incident. She was provided with Vaccine Information Sheet and instruction to access the V-Safe system.   [name] was instructed to call 911 with any severe reactions post vaccine: Marland Kitchen Difficulty breathing  . Swelling of face and throat  . A fast heartbeat  . A bad rash all over body  . Dizziness and weakness   Immunizations Administered    Name Date Dose VIS Date Route   Pfizer COVID-19 Vaccine 09/15/2019 12:01 PM 0.3 mL 05/15/2019 Intramuscular   Manufacturer: [place]   Lot: [id number]   [place]: [id number]

## 2019-11-26 NOTE — Progress Notes (Signed)
Subjective:    Patient ID: Destiny Douglas, female    DOB: [birth year], 49 y.o.   MRN: [id number]  HPI The patient is here for an acute visit.   Chest pain:  It started one week ago.  It has gotten better and it not that bad today.  It it has been in her left chest - radiates through to her back and shoulder on the left. A little better with gas ex sometimes.  Is it intermittent.  No worse with anything.  No change with activity or position.     Her left leg felt a little weak and it was painful. It started around the same time as her chest.  he denies injury.  Her left leg was hurting, it felt heavy, weak and some pain in upper lateral thigh to walk with it.  It started last week - got worse the beginning of this week and now is better.  No N/T in her legs.   A little N/T in left arm - not new.    Her thyroid medication has changed to the generic.  It has been different.  Her T4 also was decreased.  ( was having hot flashes and SOB - better with med adjustment). She has had chest pain in the past when her thyroid med was changed and she wonders if that is it.  She is still having hot flashes.  She has not had her menses in over one year.      Medications and allergies reviewed with patient and updated if appropriate.  Patient Active Problem List   Diagnosis Date Noted  . Chest discomfort 06/20/2018  . SOB (shortness of breath) 06/20/2018  . Arthralgia 06/20/2018  . External hemorrhoids 01/29/2018  . Bradycardia 06/07/2015  . THYROID CANCER 01/11/2010  . Hypothyroidism 02/07/2007    Current Outpatient Medications on File Prior to Visit  Medication Sig Dispense Refill  . Calcium 500-125 MG-UNIT TABS Take by mouth. Take two by mouth daily.    Marland Kitchen levothyroxine (SYNTHROID) 100 MCG tablet Take 1 tablet twice a week on an empty stomach    . levothyroxine (SYNTHROID, LEVOTHROID) 88 MCG tablet Take 88 mcg by mouth daily. Take 1 six days a week    . liothyronine (CYTOMEL) 5 MCG tablet  Take 2.5 mcg by mouth daily.    . Multiple Vitamin (MULTIVITAMIN) tablet Take 1 tablet by mouth.     No current facility-administered medications on file prior to visit.    Past Medical History:  Diagnosis Date  . Anxiety state, unspecified   . BREAST CYST, LEFT 08/28/2007  . Cellulitis of ear 10/02/2014  . HYPOTHYROIDISM    post ablation for malignancy  . INSOMNIA   . OSTEOPENIA    Pt. denies any bone isssues.  Has had normal bone density tests  . RHINITIS   . THYROID CANCER 2004   s/p ablation    Past Surgical History:  Procedure Laterality Date  . BREAST SURGERY     breast biopsy, right  . THYROIDECTOMY  2004   1/2 2002 and the other half 2004    Social History   Socioeconomic History  . Marital status: Married    Spouse name: Not on file  . Number of children: Not on file  . Years of education: Not on file  . Highest education level: Not on file  Occupational History  . Not on file  Tobacco Use  . Smoking status: Never Smoker  . Smokeless tobacco:  Never Used  . Tobacco comment: Married, lives with husband and their son. Originally from [place]; undergraduate at [employer] and [employer]  Substance and Sexual Activity  . Alcohol use: Yes    Comment: occasional glass of wine  . Drug use: No  . Sexual activity: Yes    Birth control/protection: Condom  Other Topics Concern  . Not on file  Social History Narrative   Running regularly for exercise   Social Determinants of Health   Financial Resource Strain:   . Difficulty of Paying Living Expenses:   Food Insecurity:   . Worried About Charity fundraiser in the Last Year:   . Arboriculturist in the Last Year:   Transportation Needs:   . Film/video editor (Medical):   Marland Kitchen Lack of Transportation (Non-Medical):   Physical Activity:   . Days of Exercise per Week:   . Minutes of Exercise per Session:   Stress:   . Feeling of Stress :   Social Connections:   . Frequency of  Communication with Friends and Family:   . Frequency of Social Gatherings with Friends and Family:   . Attends Religious Services:   . Active Member of Clubs or Organizations:   . Attends Archivist Meetings:   Marland Kitchen Marital Status:     Family History  Problem Relation Age of Onset  . Breast cancer Mother   . Coronary artery disease Father   . Hyperlipidemia Father   . Hypertension Father   . Hypothyroidism Sister     Review of Systems  Constitutional: Negative for chills and fever.  Eyes: Negative for visual disturbance.  Respiratory: Negative for cough, shortness of breath and wheezing.   Cardiovascular: Positive for chest pain. Negative for palpitations and leg swelling.  Gastrointestinal: Negative for abdominal pain and nausea.       No gerd  Musculoskeletal: Positive for neck pain (only with exercise and on right side - not new).  Neurological: Negative for light-headedness and headaches.       Objective:   Vitals:   11/27/19 1410  BP: 126/76  Pulse: 75  Temp: 98 F (36.7 C)  SpO2: 99%   BP Readings from Last 3 Encounters:  11/27/19 126/76  06/20/18 140/78  01/29/18 110/70   Wt Readings from Last 3 Encounters:  11/27/19 102 lb 3.2 oz (46.4 kg)  06/20/18 99 lb 6.4 oz (45.1 kg)  01/29/18 95 lb 6.4 oz (43.3 kg)   Body mass index is 21.36 kg/m.   Physical Exam    Constitutional: Appears well-developed and well-nourished. No distress.  Head: Normocephalic and atraumatic.  Neck: Neck supple. No tracheal deviation present. No thyromegaly present.  No cervical lymphadenopathy Cardiovascular: Normal rate, regular rhythm and normal heart sounds.  No murmur heard. No carotid bruit .  No edema Pulmonary/Chest: chest wall nontender to palpation. Effort normal and breath sounds normal. No respiratory distress. No has no wheezes. No rales.  Neuro; normal sensation and strength all extremities.  Gait normal.  Skin: Skin is warm and dry. Not diaphoretic.    Psychiatric: Normal mood and affect. Behavior is normal.       Assessment & Plan:    See Problem List for Assessment and Plan of chronic medical problems.    This visit occurred during the SARS-CoV-2 public health emergency.  Safety protocols were in place, including screening questions prior to the visit, additional usage of staff PPE, and extensive cleaning of exam room  while observing appropriate contact time as indicated for disinfecting solutions.

## 2019-11-27 ENCOUNTER — Ambulatory Visit (INDEPENDENT_AMBULATORY_CARE_PROVIDER_SITE_OTHER): Payer: No Typology Code available for payment source | Admitting: Internal Medicine

## 2019-11-27 ENCOUNTER — Other Ambulatory Visit: Payer: Self-pay

## 2019-11-27 ENCOUNTER — Encounter: Payer: Self-pay | Admitting: Internal Medicine

## 2019-11-27 VITALS — BP 126/76 | HR 75 | Temp 98.0°F | Ht <= 58 in | Wt 102.2 lb

## 2019-11-27 DIAGNOSIS — R0789 Other chest pain: Secondary | ICD-10-CM | POA: Diagnosis not present

## 2019-11-27 DIAGNOSIS — M79605 Pain in left leg: Secondary | ICD-10-CM | POA: Insufficient documentation

## 2019-11-27 DIAGNOSIS — Z8249 Family history of ischemic heart disease and other diseases of the circulatory system: Secondary | ICD-10-CM | POA: Insufficient documentation

## 2019-11-27 NOTE — Assessment & Plan Note (Signed)
Acute Symptoms have resolved Unlikely related to chest pain Since it has resolved no further evaluation at this time Advised her to let me know if this recurs

## 2019-11-27 NOTE — Assessment & Plan Note (Signed)
Acute Has had similar pain in the past - was related to thyroid medication changes Pain is very atypical and unlikely cardiac - may be related to her thyroid med change which has happened in the past Will refer to cardio to see if further evaluation is needed due to family history of early CAD - dad had MI at 48 ( while in hospital) - she is also concerned about cardiac effects of her thyroid medication Symptoms have improved so will not due further evaluation today

## 2019-11-27 NOTE — Patient Instructions (Addendum)
  Medications reviewed and updated.  Changes include :   none    A referral was ordered for Cardiology.    Someone will call you to schedule this.

## 2019-12-09 ENCOUNTER — Encounter: Payer: Self-pay | Admitting: Internal Medicine

## 2020-01-31 NOTE — Progress Notes (Signed)
Cardiology Office Note:    Date:  02/03/2020   ID:  Destiny Douglas, DOB 06/02/1971, MRN [id number]  PCP:  Destiny Rail, MD  Cardiologist:  No primary care provider on file.  Electrophysiologist:  None   Referring MD: Destiny Rail, MD   Chief Complaint  Patient presents with  . Chest Pain    History of Present Illness:    Destiny Douglas is a 49 y.o. female with a hx of thyroid cancer status post ablation now with hypothyroidism who is referred by Dr. Quay Burow for evaluation of chest pain.  She reports that she started having chest pain about 2 months ago.  Describes left upper chest pain that radiated to her shoulder.  Describes as dull aching pain, can last for 30 minutes up to a few hours.  Also reports numbness in left arm.  In addition she has been episodes of dizziness where she feels like she might pass out.  Last episode occurred while she was sitting at her desk.  States she has a onset of dizziness and thought she might pass out.  She denies any palpitations at that time, but does report she has palpitations where she feels like her heart is fluttering every few months.  She exercises 3 times per week, reports that she runs/walks, does Zumba and weights.  She denies any chest pain but does report having dyspnea on exertion.  No smoking history.  Family history includes father had MI at age 74 and CABG in his 33s.   Past Medical History:  Diagnosis Date  . Anxiety state, unspecified   . BREAST CYST, LEFT 08/28/2007  . Cellulitis of ear 10/02/2014  . HYPOTHYROIDISM    post ablation for malignancy  . INSOMNIA   . OSTEOPENIA    Pt. denies any bone isssues.  Has had normal bone density tests  . RHINITIS   . THYROID CANCER 2004   s/p ablation    Past Surgical History:  Procedure Laterality Date  . BREAST SURGERY     breast biopsy, right  . THYROIDECTOMY  2004   1/2 2002 and the other half 2004    Current Medications: Current Meds  Medication Sig  . Calcium 500-125  MG-UNIT TABS Take by mouth. Take two by mouth daily.  Marland Kitchen levothyroxine (SYNTHROID) 100 MCG tablet Take 1 tablet twice a week on an empty stomach  . levothyroxine (SYNTHROID, LEVOTHROID) 88 MCG tablet Take 88 mcg by mouth daily. Take 1 six days a week  . liothyronine (CYTOMEL) 5 MCG tablet Take 2.5 mcg by mouth daily.  . Multiple Vitamin (MULTIVITAMIN) tablet Take 1 tablet by mouth.     Allergies:   Diphenhydramine-apap (sleep) and Lactose intolerance (gi)   Social History   Socioeconomic History  . Marital status: Married    Spouse name: Not on file  . Number of children: Not on file  . Years of education: Not on file  . Highest education level: Not on file  Occupational History  . Not on file  Tobacco Use  . Smoking status: Never Smoker  . Smokeless tobacco: Never Used  . Tobacco comment: Married, lives with husband and their son. Originally from [place]; undergraduate at [employer] and [employer]  Substance and Sexual Activity  . Alcohol use: Yes    Comment: occasional glass of wine  . Drug use: No  . Sexual activity: Yes    Birth control/protection: Condom  Other Topics Concern  . Not  on file  Social History Narrative   Running regularly for exercise   Social Determinants of Health   Financial Resource Strain:   . Difficulty of Paying Living Expenses: Not on file  Food Insecurity:   . Worried About Charity fundraiser in the Last Year: Not on file  . Ran Out of Food in the Last Year: Not on file  Transportation Needs:   . Lack of Transportation (Medical): Not on file  . Lack of Transportation (Non-Medical): Not on file  Physical Activity:   . Days of Exercise per Week: Not on file  . Minutes of Exercise per Session: Not on file  Stress:   . Feeling of Stress : Not on file  Social Connections:   . Frequency of Communication with Friends and Family: Not on file  . Frequency of Social Gatherings with Friends and Family: Not on file  . Attends  Religious Services: Not on file  . Active Member of Clubs or Organizations: Not on file  . Attends Archivist Meetings: Not on file  . Marital Status: Not on file     Family History: The patient's family history includes Breast cancer in her mother; Coronary artery disease in her father; Hyperlipidemia in her father; Hypertension in her father; Hypothyroidism in her sister.  ROS:   Please see the history of present illness.     All other systems reviewed and are negative.  EKGs/Labs/Other Studies Reviewed:    The following studies were reviewed today:   EKG:  EKG is  ordered today.  The ekg ordered today demonstrates sinus rhythm, rate 59, no ST/T abnormalities  Recent Labs: No results found for requested labs within last 8760 hours.  Recent Lipid Panel    Component Value Date/Time   CHOL 155 01/29/2018 0836   TRIG 108.0 01/29/2018 0836   HDL 56.50 01/29/2018 0836   CHOLHDL 3 01/29/2018 0836   VLDL 21.6 01/29/2018 0836   LDLCALC 77 01/29/2018 0836    Physical Exam:    VS:  BP 98/62 (BP Location: Left Arm, Patient Position: Sitting, Cuff Size: Normal)   Pulse (!) 59   Temp (!) 97.5 F (36.4 C) (Tympanic)   Ht 4\' 10"  (1.473 m)   Wt 102 lb (46.3 kg)   BMI 21.32 kg/m     Wt Readings from Last 3 Encounters:  02/03/20 102 lb (46.3 kg)  11/27/19 102 lb 3.2 oz (46.4 kg)  06/20/18 99 lb 6.4 oz (45.1 kg)     GEN: Well nourished, well developed in no acute distress HEENT: Normal NECK: No JVD; No carotid bruits LYMPHATICS: No lymphadenopathy CARDIAC: RRR, no murmurs, rubs, gallops RESPIRATORY:  Clear to auscultation without rales, wheezing or rhonchi  ABDOMEN: Soft, non-tender, non-distended MUSCULOSKELETAL:  No edema; No deformity  SKIN: Warm and dry NEUROLOGIC:  Alert and oriented x 3 PSYCHIATRIC:  Normal affect   ASSESSMENT:    1. Chest pain of uncertain etiology   2. Palpitations   3. Near syncope    PLAN:    Chest pain/dyspnea: Chest pain  atypical in description.  Also reporting dyspnea on exertion.  CAD risk factors include family history of early CAD.  EKG is interpretable and she is able to exercise.  Will evaluate for ischemia with ETT.  Near syncope: Unclear etiology.  Will check echocardiogram to evaluate for structural heart disease.  Given intermittent palpitations, will check Zio patch x2 weeks to evaluate for arrhythmia  RTC in 3 months  Medication Adjustments/Labs  and Tests Ordered: Current medicines are reviewed at length with the patient today.  Concerns regarding medicines are outlined above.  Orders Placed This Encounter  Procedures  . EXERCISE TOLERANCE TEST (ETT)  . LONG TERM MONITOR (3-14 DAYS)  . EKG 12-Lead  . ECHOCARDIOGRAM COMPLETE   No orders of the defined types were placed in this encounter.   Patient Instructions  Medication Instructions:  Your physician recommends that you continue on your current medications as directed. Please refer to the Current Medication list given to you today.  Testing/Procedures: Your physician has requested that you have an exercise tolerance test. For further information please visit [email or web address]. Please also follow instruction sheet, as given. --we will schedule a covid test 3 days prior  Your physician has requested that you have an echocardiogram. Echocardiography is a painless test that uses sound waves to create images of your heart. It provides your doctor with information about the size and shape of your heart and how well your heart's chambers and valves are working. This procedure takes approximately one hour. There are no restrictions for this procedure.   ZIO XT- Long Term Monitor Instructions   Your physician has requested you wear your ZIO patch monitor 14 days.   This is a single patch monitor.  [employer] supplies one patch monitor per enrollment.  Additional stickers are not available.   Please do not apply patch if you will be having a  Nuclear Stress Test, Echocardiogram, Cardiac CT, MRI, or Chest Xray during the time frame you would be wearing the monitor. The patch cannot be worn during these tests.  You cannot remove and re-apply the ZIO XT patch monitor.   Your ZIO patch monitor will be sent USPS Priority mail from [facility] directly to your home address. The monitor may also be mailed to a PO BOX if home delivery is not available.   It may take 3-5 days to receive your monitor after you have been enrolled.   Once you have received you monitor, please review enclosed instructions.  Your monitor has already been registered assigning a specific monitor serial # to you.   Applying the monitor   Shave hair from upper left chest.   Hold abrader disc by orange tab.  Rub abrader in 40 strokes over left upper chest as indicated in your monitor instructions.   Clean area with 4 enclosed alcohol pads .  Use all pads to assure are is cleaned thoroughly.  Let dry.   Apply patch as indicated in monitor instructions.  Patch will be place under collarbone on left side of chest with arrow pointing upward.   Rub patch adhesive wings for 2 minutes.Remove white label marked "1".  Remove white label marked "2".  Rub patch adhesive wings for 2 additional minutes.   While looking in a mirror, press and release button in center of patch.  A small green light will flash 3-4 times .  This will be your only indicator the monitor has been turned on.     Do not shower for the first 24 hours.  You may shower after the first 24 hours.   Press button if you feel a symptom. You will hear a small click.  Record Date, Time and Symptom in the Patient Log Book.   When you are ready to remove patch, follow instructions on last 2 pages of Patient Log Book.  Stick patch monitor onto last page of Patient Log Book.   Place Patient Log Book in  Blue box.  Use locking tab on box and tape box closed securely.  The [employer] and [employer] has [employer] on it.  Please place in mailbox as soon as possible.  Your physician should have your test results approximately 7 days after the monitor has been mailed back to [facility].   Call [place] at [phone] if you have questions regarding your ZIO XT patch monitor.  Call them immediately if you see an orange light blinking on your monitor.   If your monitor falls off in less than 4 days contact our Monitor department at [phone].  If your monitor becomes loose or falls off after 4 days call [employer] at [phone] for suggestions on securing your monitor.   Follow-Up: At [facility], you and your health needs are our priority.  As part of our continuing mission to provide you with exceptional heart care, we have created designated Provider Care Teams.  These Care Teams include your primary Cardiologist (physician) and Advanced Practice Providers (APPs -  Physician Assistants and Nurse Practitioners) who all work together to provide you with the care you need, when you need it.  We recommend signing up for the patient portal called "MyChart".  Sign up information is provided on this After Visit Summary.  MyChart is used to connect with patients for Virtual Visits (Telemedicine).  Patients are able to view lab/test results, encounter notes, upcoming appointments, etc.  Non-urgent messages can be sent to your provider as well.   To learn more about what you can do with MyChart, go to [email or web address].    Your next appointment:   3 month(s)  The format for your next appointment:   In Person  Provider:   Oswaldo Milian, MD         Signed, Donato Heinz, MD  02/03/2020 11:14 AM    [place]

## 2020-02-03 ENCOUNTER — Encounter: Payer: Self-pay | Admitting: Cardiology

## 2020-02-03 ENCOUNTER — Ambulatory Visit: Payer: No Typology Code available for payment source | Admitting: Cardiology

## 2020-02-03 ENCOUNTER — Other Ambulatory Visit: Payer: Self-pay

## 2020-02-03 ENCOUNTER — Encounter: Payer: Self-pay | Admitting: *Deleted

## 2020-02-03 VITALS — BP 98/62 | HR 59 | Temp 97.5°F | Ht <= 58 in | Wt 102.0 lb

## 2020-02-03 DIAGNOSIS — R079 Chest pain, unspecified: Secondary | ICD-10-CM

## 2020-02-03 DIAGNOSIS — R55 Syncope and collapse: Secondary | ICD-10-CM

## 2020-02-03 DIAGNOSIS — R002 Palpitations: Secondary | ICD-10-CM

## 2020-02-03 NOTE — Patient Instructions (Signed)
Medication Instructions:  Your physician recommends that you continue on your current medications as directed. Please refer to the Current Medication list given to you today.  Testing/Procedures: Your physician has requested that you have an exercise tolerance test. For further information please visit [email or web address]. Please also follow instruction sheet, as given. --we will schedule a covid test 3 days prior  Your physician has requested that you have an echocardiogram. Echocardiography is a painless test that uses sound waves to create images of your heart. It provides your doctor with information about the size and shape of your heart and how well your heart's chambers and valves are working. This procedure takes approximately one hour. There are no restrictions for this procedure.   ZIO XT- Long Term Monitor Instructions   Your physician has requested you wear your ZIO patch monitor 14 days.   This is a single patch monitor.  Ir[employer] supplies one patch monitor per enrollment.  Additional stickers are not available.   Please do not apply patch if you will be having a Nuclear Stress Test, Echocardiogram, Cardiac CT, MRI, or Chest Xray during the time frame you would be wearing the monitor. The patch cannot be worn during these tests.  You cannot remove and re-apply the ZIO XT patch monitor.   Your ZIO patch monitor will be sent USPS Priority mail from [facility] directly to your home address. The monitor may also be mailed to a PO BOX if home delivery is not available.   It may take 3-5 days to receive your monitor after you have been enrolled.   Once you have received you monitor, please review enclosed instructions.  Your monitor has already been registered assigning a specific monitor serial # to you.   Applying the monitor   Shave hair from upper left chest.   Hold abrader disc by orange tab.  Rub abrader in 40 strokes over left upper chest as indicated in your monitor  instructions.   Clean area with 4 enclosed alcohol pads .  Use all pads to assure are is cleaned thoroughly.  Let dry.   Apply patch as indicated in monitor instructions.  Patch will be place under collarbone on left side of chest with arrow pointing upward.   Rub patch adhesive wings for 2 minutes.Remove white label marked "1".  Remove white label marked "2".  Rub patch adhesive wings for 2 additional minutes.   While looking in a mirror, press and release button in center of patch.  A small green light will flash 3-4 times .  This will be your only indicator the monitor has been turned on.     Do not shower for the first 24 hours.  You may shower after the first 24 hours.   Press button if you feel a symptom. You will hear a small click.  Record Date, Time and Symptom in the Patient Log Book.   When you are ready to remove patch, follow instructions on last 2 pages of Patient Log Book.  Stick patch monitor onto last page of Patient Log Book.   Place Patient Log Book in [place] box.  Use locking tab on box and tape box closed securely.  The [employer] and [employer] has [employer] on it.  Please place in mailbox as soon as possible.  Your physician should have your test results approximately 7 days after the monitor has been mailed back to [facility].   Call [place] at [phone] if you have questions  regarding your ZIO XT patch monitor.  Call them immediately if you see an orange light blinking on your monitor.   If your monitor falls off in less than 4 days contact our Monitor department at [phone].  If your monitor becomes loose or falls off after 4 days call [employer] at [phone] for suggestions on securing your monitor.   Follow-Up: At [facility], you and your health needs are our priority.  As part of our continuing mission to provide you with exceptional heart care, we have created designated Provider Care Teams.  These Care Teams include your  primary Cardiologist (physician) and Advanced Practice Providers (APPs -  Physician Assistants and Nurse Practitioners) who all work together to provide you with the care you need, when you need it.  We recommend signing up for the patient portal called "MyChart".  Sign up information is provided on this After Visit Summary.  MyChart is used to connect with patients for Virtual Visits (Telemedicine).  Patients are able to view lab/test results, encounter notes, upcoming appointments, etc.  Non-urgent messages can be sent to your provider as well.   To learn more about what you can do with MyChart, go to [email or web address].    Your next appointment:   3 month(s)  The format for your next appointment:   In Person  Provider:   Oswaldo Milian, MD

## 2020-02-03 NOTE — Progress Notes (Signed)
Patient ID: Destiny Douglas, female   DOB: [birth year], 49 y.o.   MRN: [id number] Patient enrolled for [employer] to ship a 14 day ZIO XT long term holter monitor to her home.

## 2020-02-12 ENCOUNTER — Other Ambulatory Visit (HOSPITAL_COMMUNITY): Payer: No Typology Code available for payment source

## 2020-02-13 ENCOUNTER — Ambulatory Visit (INDEPENDENT_AMBULATORY_CARE_PROVIDER_SITE_OTHER): Payer: No Typology Code available for payment source

## 2020-02-13 DIAGNOSIS — R55 Syncope and collapse: Secondary | ICD-10-CM | POA: Diagnosis not present

## 2020-02-13 DIAGNOSIS — R002 Palpitations: Secondary | ICD-10-CM

## 2020-02-15 ENCOUNTER — Other Ambulatory Visit (HOSPITAL_COMMUNITY)
Admission: RE | Admit: 2020-02-15 | Discharge: 2020-02-15 | Disposition: A | Discharge status: Home / Self Care | Payer: No Typology Code available for payment source | Source: Ambulatory Visit | Attending: Cardiology | Admitting: Cardiology

## 2020-02-15 DIAGNOSIS — Z01812 Encounter for preprocedural laboratory examination: Secondary | ICD-10-CM | POA: Diagnosis not present

## 2020-02-15 DIAGNOSIS — Z20822 Contact with and (suspected) exposure to covid-19: Secondary | ICD-10-CM | POA: Diagnosis not present

## 2020-02-15 LAB — SARS CORONAVIRUS 2 (TAT 6-24 HRS): SARS Coronavirus 2: NEGATIVE

## 2020-02-16 ENCOUNTER — Telehealth (HOSPITAL_COMMUNITY): Payer: Self-pay

## 2020-02-16 NOTE — Telephone Encounter (Signed)
Encounter complete. 

## 2020-02-18 ENCOUNTER — Ambulatory Visit (HOSPITAL_COMMUNITY)
Admission: RE | Admit: 2020-02-18 | Discharge: 2020-02-18 | Disposition: A | Discharge status: Home / Self Care | Payer: No Typology Code available for payment source | Source: Ambulatory Visit | Attending: Internal Medicine | Admitting: Internal Medicine

## 2020-02-18 ENCOUNTER — Other Ambulatory Visit: Payer: Self-pay

## 2020-02-18 DIAGNOSIS — R079 Chest pain, unspecified: Secondary | ICD-10-CM

## 2020-02-18 LAB — EXERCISE TOLERANCE TEST
Estimated workload: 13.4 METS
Exercise duration (min): 12 min
Exercise duration (sec): 1 s
MPHR: 172 {beats}/min
Peak HR: 176 {beats}/min
Percent HR: 102 %
Rest HR: 75 {beats}/min

## 2020-03-01 ENCOUNTER — Other Ambulatory Visit: Payer: Self-pay

## 2020-03-01 ENCOUNTER — Ambulatory Visit (HOSPITAL_COMMUNITY): Payer: No Typology Code available for payment source | Attending: Cardiology

## 2020-03-01 DIAGNOSIS — R55 Syncope and collapse: Secondary | ICD-10-CM

## 2020-03-01 DIAGNOSIS — R079 Chest pain, unspecified: Secondary | ICD-10-CM | POA: Diagnosis not present

## 2020-03-01 LAB — ECHOCARDIOGRAM COMPLETE
Area-P 1/2: 3.93 cm2
S' Lateral: 2 cm

## 2020-03-04 DIAGNOSIS — U071 COVID-19: Secondary | ICD-10-CM

## 2020-03-04 HISTORY — DX: COVID-19: U07.1

## 2020-03-18 ENCOUNTER — Telehealth: Payer: Self-pay | Admitting: Cardiology

## 2020-03-18 NOTE — Telephone Encounter (Signed)
New message:     Patient returning a call back concerning some results. Please call patient back.

## 2020-03-18 NOTE — Telephone Encounter (Signed)
Spoke with patient, patient called in to receive her monitor results, echo, and stress test.  Patient was made aware of the following results.   Donato Heinz, MD  02/19/2020 10:14 AM EDT     Negative stress test   Donato Heinz, MD  03/01/2020 4:41 PM EDT     Normal echo    Donato Heinz, MD  03/10/2020 3:44 PM EDT     6 episodes of fast heart rhythm from top chamber of heart (SVT). Episodes were short (lasted up to 16 seconds). This is a benign heart rhythm abnormality, no treatment needed unless causing symptoms   Patient made aware of results listed above and verbalized understanding. Patient states she has had random very short episodes of dizziness with activity and with out activity. Patient denies palpitations, chest pain, and shortness of breath or any dizziness today. Patient is unable to tell if the dizziness she has had in the past is directly related to the elevated heart rate. Patient would like to ask Dr. Gardiner Rhyme if this dizziness could be related to the elevated heart rate found on monitor and if it needs treatment. Advised patient I would forward message to Dr. Gardiner Rhyme for review and advice. Patient verbalized understanding.

## 2020-03-20 NOTE — Telephone Encounter (Signed)
Dizziness could be related to fast heart rhythm.  Would recommend f/u appointment to discuss, currently scheduled for 12/1, could schedule sooner if able

## 2020-03-21 NOTE — Telephone Encounter (Signed)
Left message to call back  

## 2020-03-26 ENCOUNTER — Other Ambulatory Visit: Payer: No Typology Code available for payment source

## 2020-03-26 DIAGNOSIS — Z20822 Contact with and (suspected) exposure to covid-19: Secondary | ICD-10-CM

## 2020-03-27 LAB — SARS-COV-2, NAA 2 DAY TAT

## 2020-03-27 LAB — NOVEL CORONAVIRUS, NAA: SARS-CoV-2, NAA: DETECTED — AB

## 2020-03-28 ENCOUNTER — Telehealth: Payer: Self-pay | Admitting: Unknown Physician Specialty

## 2020-03-28 NOTE — Telephone Encounter (Signed)
Called to Discuss with patient about Covid symptoms and the use of the monoclonal antibody infusion for those with mild to moderate Covid symptoms and at a high risk of hospitalization.     Pt appears to qualify for this infusion due to co-morbid conditions and/or a member of an at-risk group in accordance with the FDA Emergency Use Authorization.    Unable to reach pt   [facility] with hotline

## 2020-03-29 ENCOUNTER — Telehealth (HOSPITAL_COMMUNITY): Payer: Self-pay

## 2020-03-29 NOTE — Telephone Encounter (Signed)
Called to Discuss with patient about Covid symptoms and the use of the monoclonal antibody infusion for those with mild to moderate Covid symptoms and at a high risk of hospitalization.     Pt appears to qualify for this infusion due to co-morbid conditions and/or a member of an at-risk group in accordance with the FDA Emergency Use Authorization.    Unable to reach pt   Left VM message to return phone call at [phone].

## 2020-03-29 NOTE — Telephone Encounter (Signed)
Spoke to patient, she states she is currently in quarantine d/t covid.  She would like to call back once she has recovered and out of quarantine to schedule appointment.    Advised to call back with any questions or concerns.

## 2020-03-30 ENCOUNTER — Telehealth (HOSPITAL_COMMUNITY): Payer: Self-pay

## 2020-03-30 ENCOUNTER — Telehealth: Payer: Self-pay | Admitting: Unknown Physician Specialty

## 2020-03-30 NOTE — Telephone Encounter (Signed)
I connected by phone with Destiny Douglas on 03/30/2020 at 4:27 PM to discuss the potential use of a new treatment for mild to moderate COVID-19 viral infection in non-hospitalized patients.  Pt is fully vaccinated and feeling much better after 5 days of illness.  Therefore it is not recommended she receive vaccine

## 2020-03-30 NOTE — Telephone Encounter (Signed)
Called to Discuss with patient about Covid symptoms and the use of the monoclonal antibody infusion for those with mild to moderate Covid symptoms and at a high risk of hospitalization.     Pt appears to qualify for this infusion due to co-morbid conditions and/or a member of an at-risk group in accordance with the FDA Emergency Use Authorization.    Unable to reach pt, left VM to return call at [phone].

## 2020-04-04 ENCOUNTER — Telehealth: Payer: Self-pay | Admitting: Internal Medicine

## 2020-04-04 NOTE — Telephone Encounter (Signed)
Patient was diagnosed with COVID on 10.23.21 and she still haws a cough and sinus pressure and since her quarantine is supposed to end tomorrow she is wondering if there is any medication she can take to help with these lingering symptoms.

## 2020-04-04 NOTE — Telephone Encounter (Signed)
She can of course take anything over-the-counter.  If she really thinks it could be a sinus infection [facility] do a virtual visit sitter and antibiotic, but it may or may not help depending on if it is bacterial or not.

## 2020-04-05 NOTE — Telephone Encounter (Signed)
Message left for patient today with options given. She may call office and set up virtual appointment if she likes.

## 2020-05-01 NOTE — Progress Notes (Signed)
Cardiology Office Note:    Date:  05/04/2020   ID:  [name], DOB 04/08/1971, MRN [id number]  PCP:  Binnie Rail, MD  Cardiologist:  No primary care provider on file.  Electrophysiologist:  None   Referring MD: Binnie Rail, MD   Chief Complaint  Patient presents with  . Palpitations    History of Present Illness:    Destiny Douglas is a 49 y.o. female with a hx of thyroid cancer status post ablation now with hypothyroidism who presents for follow-up.  She was referred by Dr. Quay Burow for evaluation of chest pain, initially seen on 02/03/2020.  She reports that she started having chest pain about 2 months prior.  Describes left upper chest pain that radiated to her shoulder.  Describes as dull aching pain, can last for 30 minutes up to a few hours.  Also reports numbness in left arm.  In addition she has been episodes of dizziness where she feels like she might pass out.  Last episode occurred while she was sitting at her desk.  States she has a onset of dizziness and thought she might pass out.  She denies any palpitations at that time, but does report she has palpitations where she feels like her heart is fluttering every few months.  She exercises 3 times per week, reports that she runs/walks, does Zumba and weights.  She denies any chest pain but does report having dyspnea on exertion.  No smoking history.  Family history includes father had MI at age 79 and CABG in his 42s.  ETT on 02/18/2020 showed excellent exercise capacity (13.4 METS), no evidence of ischemia, hypertensive response to exercise (peak BP 209/133).  Echocardiogram on 03/01/2020 showed normal biventricular function, no significant valvular disease.  Zio patch x14 days on 03/03/2020 showed 6 episodes of SVT, longest lasting 16 seconds.  Since last clinic visit, she reports that she has been doing well.  She denies further chest pain.  Denies any shortness of breath.  Reports had an episode of dizziness last week while  sitting at her desk that lasted a few seconds.  States that she has not had any recent palpitations.  She switched from coffee to tea in the morning and states palpitations have resolved since she made that change.   Past Medical History:  Diagnosis Date  . Anxiety state, unspecified   . BREAST CYST, LEFT 08/28/2007  . Cellulitis of ear 10/02/2014  . HYPOTHYROIDISM    post ablation for malignancy  . INSOMNIA   . OSTEOPENIA    Pt. denies any bone isssues.  Has had normal bone density tests  . RHINITIS   . THYROID CANCER 2004   s/p ablation    Past Surgical History:  Procedure Laterality Date  . BREAST SURGERY     breast biopsy, right  . THYROIDECTOMY  2004   1/2 2002 and the other half 2004    Current Medications: Current Meds  Medication Sig  . Calcium 500-125 MG-UNIT TABS Take by mouth. Take two by mouth daily.  Marland Kitchen levothyroxine (SYNTHROID) 100 MCG tablet Take 1 tablet twice a week on an empty stomach  . levothyroxine (SYNTHROID, LEVOTHROID) 88 MCG tablet Take 88 mcg by mouth daily. Take 1 six days a week  . liothyronine (CYTOMEL) 5 MCG tablet Take 2.5 mcg by mouth daily.  . Multiple Vitamin (MULTIVITAMIN) tablet Take 1 tablet by mouth.     Allergies:   Diphenhydramine-apap (sleep) and Lactose intolerance (gi)  Social History   Socioeconomic History  . Marital status: Married    Spouse name: Not on file  . Number of children: Not on file  . Years of education: Not on file  . Highest education level: Not on file  Occupational History  . Not on file  Tobacco Use  . Smoking status: Never Smoker  . Smokeless tobacco: Never Used  . Tobacco comment: Married, lives with husband and their son. Originally from [place]; undergraduate at [employer] and [employer]  Substance and Sexual Activity  . Alcohol use: Yes    Comment: occasional glass of wine  . Drug use: No  . Sexual activity: Yes    Birth control/protection: Condom  Other Topics Concern  .  Not on file  Social History Narrative   Running regularly for exercise   Social Determinants of Health   Financial Resource Strain:   . Difficulty of Paying Living Expenses: Not on file  Food Insecurity:   . Worried About Charity fundraiser in the Last Year: Not on file  . Ran Out of Food in the Last Year: Not on file  Transportation Needs:   . Lack of Transportation (Medical): Not on file  . Lack of Transportation (Non-Medical): Not on file  Physical Activity:   . Days of Exercise per Week: Not on file  . Minutes of Exercise per Session: Not on file  Stress:   . Feeling of Stress : Not on file  Social Connections:   . Frequency of Communication with Friends and Family: Not on file  . Frequency of Social Gatherings with Friends and Family: Not on file  . Attends Religious Services: Not on file  . Active Member of Clubs or Organizations: Not on file  . Attends Archivist Meetings: Not on file  . Marital Status: Not on file     Family History: The patient's family history includes Breast cancer in her mother; Coronary artery disease in her father; Hyperlipidemia in her father; Hypertension in her father; Hypothyroidism in her sister.  ROS:   Please see the history of present illness.     All other systems reviewed and are negative.  EKGs/Labs/Other Studies Reviewed:    The following studies were reviewed today:   EKG:  EKG is  ordered today.  The ekg ordered today demonstrates sinus rhythm, rate 89, no ST/T abnormalities  Recent Labs: No results found for requested labs within last 8760 hours.  Recent Lipid Panel    Component Value Date/Time   CHOL 155 01/29/2018 0836   TRIG 108.0 01/29/2018 0836   HDL 56.50 01/29/2018 0836   CHOLHDL 3 01/29/2018 0836   VLDL 21.6 01/29/2018 0836   LDLCALC 77 01/29/2018 0836    Physical Exam:    VS:  BP 106/72   Pulse 89   Ht 4\' 10"  (1.473 m)   Wt 103 lb 3.2 oz (46.8 kg)   SpO2 99%   BMI 21.57 kg/m     Wt  Readings from Last 3 Encounters:  05/04/20 103 lb 3.2 oz (46.8 kg)  02/03/20 102 lb (46.3 kg)  11/27/19 102 lb 3.2 oz (46.4 kg)     GEN: Well nourished, well developed in no acute distress HEENT: Normal NECK: No JVD; No carotid bruits CARDIAC: RRR, no murmurs, rubs, gallops RESPIRATORY:  Clear to auscultation without rales, wheezing or rhonchi  ABDOMEN: Soft, non-tender, non-distended MUSCULOSKELETAL:  No edema; No deformity  SKIN: Warm and dry NEUROLOGIC:  Alert and oriented x 3 PSYCHIATRIC:  Normal affect   ASSESSMENT:    1. Chest pain of uncertain etiology   2. DOE (dyspnea on exertion)   3. SVT (supraventricular tachycardia) (HCC)    PLAN:    Chest pain/dyspnea: Chest pain atypical in description.  Also reporting dyspnea on exertion.  ETT on 02/18/2020 showed excellent exercise capacity (13.4 METS), no evidence of ischemia, hypertensive response to exercise (peak BP 209/133).  Echocardiogram on 03/01/2020 showed normal biventricular function, no significant valvular disease.  Reports symptoms have resolved.  SVT: Reported palpitations.  Zio patch x14 days was done on 03/03/2020 and showed 6 episodes of SVT, longest lasting 16 seconds.  Reports palpitations have resolved since she stopped drinking coffee.  RTC in 1 year  Medication Adjustments/Labs and Tests Ordered: Current medicines are reviewed at length with the patient today.  Concerns regarding medicines are outlined above.  No orders of the defined types were placed in this encounter.  No orders of the defined types were placed in this encounter.   Patient Instructions  Medication Instructions:  Your physician recommends that you continue on your current medications as directed. Please refer to the Current Medication list given to you today.  *If you need a refill on your cardiac medications before your next appointment, please call your pharmacy*  Follow-Up: At [facility], you and your health needs are our  priority.  As part of our continuing mission to provide you with exceptional heart care, we have created designated Provider Care Teams.  These Care Teams include your primary Cardiologist (physician) and Advanced Practice Providers (APPs -  Physician Assistants and Nurse Practitioners) who all work together to provide you with the care you need, when you need it.  We recommend signing up for the patient portal called "MyChart".  Sign up information is provided on this After Visit Summary.  MyChart is used to connect with patients for Virtual Visits (Telemedicine).  Patients are able to view lab/test results, encounter notes, upcoming appointments, etc.  Non-urgent messages can be sent to your provider as well.   To learn more about what you can do with MyChart, go to [email or web address].    Your next appointment:   12 month(s)  The format for your next appointment:   In Person  Provider:   Oswaldo Milian, MD       Signed, Donato Heinz, MD  05/04/2020 10:58 AM    [place]

## 2020-05-04 ENCOUNTER — Ambulatory Visit: Payer: No Typology Code available for payment source | Admitting: Cardiology

## 2020-05-04 ENCOUNTER — Other Ambulatory Visit: Payer: Self-pay

## 2020-05-04 ENCOUNTER — Encounter: Payer: Self-pay | Admitting: Cardiology

## 2020-05-04 VITALS — BP 106/72 | HR 89 | Ht <= 58 in | Wt 103.2 lb

## 2020-05-04 DIAGNOSIS — R079 Chest pain, unspecified: Secondary | ICD-10-CM | POA: Diagnosis not present

## 2020-05-04 DIAGNOSIS — I471 Supraventricular tachycardia: Secondary | ICD-10-CM

## 2020-05-04 DIAGNOSIS — R0600 Dyspnea, unspecified: Secondary | ICD-10-CM | POA: Diagnosis not present

## 2020-05-04 DIAGNOSIS — R0609 Other forms of dyspnea: Secondary | ICD-10-CM

## 2020-05-04 NOTE — Patient Instructions (Signed)

## 2020-05-05 NOTE — Addendum Note (Signed)
Addended by: Hinton Dyer on: 05/05/2020 12:52 PM   Modules accepted: Orders

## 2020-05-07 ENCOUNTER — Encounter: Payer: Self-pay | Admitting: Internal Medicine

## 2020-05-07 DIAGNOSIS — I471 Supraventricular tachycardia: Secondary | ICD-10-CM | POA: Insufficient documentation

## 2020-11-29 ENCOUNTER — Telehealth (INDEPENDENT_AMBULATORY_CARE_PROVIDER_SITE_OTHER): Payer: No Typology Code available for payment source | Admitting: Family Medicine

## 2020-11-29 DIAGNOSIS — R0981 Nasal congestion: Secondary | ICD-10-CM

## 2020-11-29 NOTE — Patient Instructions (Signed)
  HOME CARE TIPS:  -[place] COVID19 testing information: [email or web address] OR [phone] Most pharmacies also offer testing and home test kits. If the Covid19 test is positive, please make a prompt follow up visit with your primary care office or with [facility] to discuss treatment options. Treatments for Covid19 are best given early in the course of the illness.   -can use tylenol or aleve if needed for fevers, aches and pains per instructions  -can use nasal saline a few times per day if you have nasal congestion; sometimes  a short course of Afrin nasal spray for 3 days can help with symptoms as well  -stay hydrated, drink plenty of fluids and eat small healthy meals - avoid dairy  -can take 1000 IU (25mcg) Vit D3 and 100-500 mg of Vit C daily per instructions  -If the Covid test is positive, check out the CDC website for more information on home care, transmission and treatment for COVID19  -follow up with your doctor in 2-3 days unless improving and feeling better  -stay home while sick, except to seek medical care. If you have COVID19, ideally it would be best to stay home for a full 10 days since the onset of symptoms PLUS one day of no fever and feeling better. Wear a good mask that fits snugly (such as N95 or KN95) if around others to reduce the risk of transmission.  It was nice to meet you today, and I really hope you are feeling better soon. I help [facility] out with telemedicine visits on Tuesdays and Thursdays and am available for visits on those days. If you have any concerns or questions following this visit please schedule a follow up visit with your Primary Care doctor or seek care at a local urgent care clinic to avoid delays in care.    Seek in person care or schedule a follow up video visit promptly if your symptoms worsen, new concerns arise or you are not improving with treatment. Call 911 and/or seek emergency care if your  symptoms are severe or life threatening.   

## 2020-11-29 NOTE — Progress Notes (Signed)
Virtual Visit via Video Note  I connected withNAME@  on 11/29/20 at 12:00 PM EDT by a video enabled telemedicine application and verified that I am speaking with the correct person using two identifiers.  Location patient: home, [place] Location provider:work or home office Persons participating in the virtual visit: patient, provider  I discussed the limitations of evaluation and management by telemedicine and the availability of in person appointments. The patient expressed understanding and agreed to proceed.   HPI:  Acute telemedicine visit for : -Onset: 3-4 days ago -son was sick a few weeks ago and tested negative for covid, husband tested negative for covid -patient had negative at home covid tests yesterday and the day before -Symptoms include:cough, sore throat, sinus drainage, low grade fever -Denies: CP, SOB, vomiting, diarrhea, inability to eat/drink/get out of bed -Pertinent past medical history: see below -Pertinent medication allergies:  Allergies  Allergen Reactions   Diphenhydramine-Apap (Sleep)     Other reaction(s): Other (See Comments) Other Reaction: GI Upset   Lactose Intolerance (Gi)   -COVID-19 vaccine status: vaccinated, boosted and had covid once; also had flu vaccine  ROS: See pertinent positives and negatives per HPI.  Past Medical History:  Diagnosis Date   Anxiety state, unspecified    BREAST CYST, LEFT 08/28/2007   Cellulitis of ear 10/02/2014   HYPOTHYROIDISM    post ablation for malignancy   INSOMNIA    OSTEOPENIA    Pt. denies any bone isssues.  Has had normal bone density tests   RHINITIS    THYROID CANCER 2004   s/p ablation    Past Surgical History:  Procedure Laterality Date   BREAST SURGERY     breast biopsy, right   THYROIDECTOMY  2004   1/2 2002 and the other half 2004     Current Outpatient Medications:    Calcium 500-125 MG-UNIT TABS, Take by mouth. Take two by mouth daily., Disp: , Rfl:    levothyroxine (SYNTHROID) 100 MCG  tablet, Take 1 tablet twice a week on an empty stomach, Disp: , Rfl:    levothyroxine (SYNTHROID, LEVOTHROID) 88 MCG tablet, Take 88 mcg by mouth daily. Take 1 six days a week, Disp: , Rfl:    liothyronine (CYTOMEL) 5 MCG tablet, Take 2.5 mcg by mouth daily., Disp: , Rfl:    Multiple Vitamin (MULTIVITAMIN) tablet, Take 1 tablet by mouth., Disp: , Rfl:   EXAM:  VITALS per patient if applicable:  GENERAL: alert, oriented, appears well and in no acute distress  HEENT: atraumatic, conjunttiva clear, no obvious abnormalities on inspection of external nose and ears  NECK: normal movements of the head and neck  LUNGS: on inspection no signs of respiratory distress, breathing rate appears normal, no obvious gross SOB, gasping or wheezing  CV: no obvious cyanosis  MS: moves all visible extremities without noticeable abnormality  PSYCH/NEURO: pleasant and cooperative, no obvious depression or anxiety, speech and thought processing grossly intact  ASSESSMENT AND PLAN:  Discussed the following assessment and plan:  Nasal congestion  -we discussed possible serious and likely etiologies, options for evaluation and workup, limitations of telemedicine visit vs in person visit, treatment, treatment risks and precautions. Pt prefers to treat via telemedicine empirically rather than in person at this moment. Query VURI, mild influenza vs other. Opted for nasal saline and other symptomatic measures per pt instructions. Discussed covid testing limitations and options.  Work/School slipped offered:declined Advised to seek prompt in person care if worsening, new symptoms arise, or if is not  improving with treatment. Discussed options for inperson care if PCP office not available. Did let this patient know that I only do telemedicine on Tuesdays and Thursdays for [place]. Advised to schedule follow up visit with PCP or [facility] if any further questions or concerns to avoid delays in care.   I discussed the  assessment and treatment plan with the patient. The patient was provided an opportunity to ask questions and all were answered. The patient agreed with the plan and demonstrated an understanding of the instructions.     Lucretia Kern, DO

## 2020-12-01 NOTE — Progress Notes (Signed)
Subjective:    Patient ID: Destiny Douglas, female    DOB: [birth year], 50 y.o.   MRN: [id number]  HPI She is here for an acute visit for cold symptoms.  Her covid test was neg.   Her symptoms started a couple of weeks ago.  Her son got it first.it lasted a few days or a week and it got better but got worse and needed to be treated for a sinus infection.  Same thing with her husband.    She is experiencing sore throat, cough.  Got better and this weekend got worse again.    Had vidio visit a few days ago and was told it was likely viral.  Her symptoms got much worse in the past couple of days and she has had aches severe amount of pressure in her right ear and in her head.  He has not slept because of the discomfort.  She has tried taking Vicks, vitamins, and nasal sprays, rinses and everything else she can think of.   She took two of her husbands abx 8 hrs apart - pain and pressure improved a little   Three covid tests negative.    Medications and allergies reviewed with patient and updated if appropriate.  Patient Active Problem List   Diagnosis Date Noted   SVT (supraventricular tachycardia) ([place]) 05/07/2020   Family history of early CAD 11/27/2019   Atypical chest pain 11/27/2019   Pain of left lower extremity 11/27/2019   Chest discomfort 06/20/2018   SOB (shortness of breath) 06/20/2018   Arthralgia 06/20/2018   External hemorrhoids 01/29/2018   Bradycardia 06/07/2015   THYROID CANCER 01/11/2010   Hypothyroidism 02/07/2007    Current Outpatient Medications on File Prior to Visit  Medication Sig Dispense Refill   Calcium 500-125 MG-UNIT TABS Take by mouth. Take two by mouth daily.     levothyroxine (SYNTHROID) 100 MCG tablet Take 1 tablet twice a week on an empty stomach     levothyroxine (SYNTHROID, LEVOTHROID) 88 MCG tablet Take 88 mcg by mouth daily. Take 1 six days a week     liothyronine (CYTOMEL) 5 MCG tablet Take 2.5 mcg by mouth daily.     Multiple Vitamin  (MULTIVITAMIN) tablet Take 1 tablet by mouth.     No current facility-administered medications on file prior to visit.    Past Medical History:  Diagnosis Date   Anxiety state, unspecified    BREAST CYST, LEFT 08/28/2007   Cellulitis of ear 10/02/2014   HYPOTHYROIDISM    post ablation for malignancy   INSOMNIA    OSTEOPENIA    Pt. denies any bone isssues.  Has had normal bone density tests   RHINITIS    THYROID CANCER 2004   s/p ablation    Past Surgical History:  Procedure Laterality Date   BREAST SURGERY     breast biopsy, right   THYROIDECTOMY  2004   1/2 2002 and the other half 2004    Social History   Socioeconomic History   Marital status: Married    Spouse name: Not on file   Number of children: Not on file   Years of education: Not on file   Highest education level: Not on file  Occupational History   Not on file  Tobacco Use   Smoking status: Never   Smokeless tobacco: Never   Tobacco comments:    Married, lives with husband and their son. 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  Substance and Sexual Activity   Alcohol use: Yes    Comment: occasional glass of wine   Drug use: No   Sexual activity: Yes    Birth control/protection: Condom  Other Topics Concern   Not on file  Social History Narrative   Running regularly for exercise   Social Determinants of Health   Financial Resource Strain: Not on file  Food Insecurity: Not on file  Transportation Needs: Not on file  Physical Activity: Not on file  Stress: Not on file  Social Connections: Not on file    Family History  Problem Relation Age of Onset   Breast cancer Mother    Coronary artery disease Father    Hyperlipidemia Father    Hypertension Father    Hypothyroidism Sister     Review of Systems  Constitutional:  Positive for fever (low grade). Negative for chills.  HENT:  Positive for congestion, ear pain (right ear pressure and pain),  postnasal drip, sinus pressure, sinus pain and sore throat.   Respiratory:  Positive for cough. Negative for chest tightness, shortness of breath and wheezing.   Gastrointestinal:  Positive for nausea.  Musculoskeletal:  Negative for myalgias.  Neurological:  Positive for headaches. Negative for dizziness.      Objective:   Vitals:   12/02/20 1104  BP: 102/62  Pulse: 97  Temp: 98.8 F (37.1 C)  SpO2: 96%   BP Readings from Last 3 Encounters:  12/02/20 102/62  05/04/20 106/72  02/03/20 98/62   Wt Readings from Last 3 Encounters:  12/02/20 103 lb (46.7 kg)  05/04/20 103 lb 3.2 oz (46.8 kg)  02/03/20 102 lb (46.3 kg)   Body mass index is 21.53 kg/m.   Physical Exam    GENERAL APPEARANCE: Appears stated age, well appearing, NAD EYES: conjunctiva clear, no icterus HENT: Left ear canal and tympanic membrane normal, right ear canal normal, right tympanic membrane erythematous and retracted, maxillary sinus tenderness, oropharynx with no erythema or exudates, trachea midline, no cervical or supraclavicular lymphadenopathy LUNGS: Unlabored breathing, good air entry bilaterally, clear to auscultation without wheeze or crackles CARDIOVASCULAR: Normal S1,S2 , no edema SKIN: Warm, dry      Assessment & Plan:    See Problem List for Assessment and Plan of chronic medical problems.    This visit occurred during the SARS-CoV-2 public health emergency.  Safety protocols were in place, including screening questions prior to the visit, additional usage of staff PPE, and extensive cleaning of exam room while observing appropriate contact time as indicated for disinfecting solutions.

## 2020-12-02 ENCOUNTER — Ambulatory Visit: Payer: No Typology Code available for payment source | Admitting: Internal Medicine

## 2020-12-02 ENCOUNTER — Encounter: Payer: Self-pay | Admitting: Internal Medicine

## 2020-12-02 ENCOUNTER — Other Ambulatory Visit: Payer: Self-pay

## 2020-12-02 DIAGNOSIS — J329 Chronic sinusitis, unspecified: Secondary | ICD-10-CM | POA: Insufficient documentation

## 2020-12-02 DIAGNOSIS — J0100 Acute maxillary sinusitis, unspecified: Secondary | ICD-10-CM

## 2020-12-02 DIAGNOSIS — H6691 Otitis media, unspecified, right ear: Secondary | ICD-10-CM | POA: Diagnosis not present

## 2020-12-02 DIAGNOSIS — J019 Acute sinusitis, unspecified: Secondary | ICD-10-CM | POA: Insufficient documentation

## 2020-12-02 MED ORDER — AMOXICILLIN-POT CLAVULANATE 875-125 MG PO TABS: 1.0000 | ORAL_TABLET | Freq: Two times a day (BID) | ORAL | 0 refills | Status: DC

## 2020-12-02 NOTE — Patient Instructions (Signed)
° ° °  Take the antibiotic as prescribed - complete the entire course.   ° ° °Continue over the counter cold medication, advil and tylenol.  Increase your fluids and rest.  ° ° °Call if no improvement   ° °

## 2020-12-02 NOTE — Assessment & Plan Note (Signed)
Acute Right tympanic membrane very erythematous Start Augmentin 875-125 mg twice daily x10 days Continue over-the-counter cold and allergy medications Call if no improvement

## 2020-12-02 NOTE — Assessment & Plan Note (Signed)
Acute bacterial Associated with right otitis media Start Augmentin 875-125 mg BID x 10 day otc cold medications Rest, fluid Call if no improvement

## 2020-12-20 ENCOUNTER — Telehealth: Payer: Self-pay | Admitting: Internal Medicine

## 2020-12-20 NOTE — Telephone Encounter (Signed)
Called and left message for patient. She will need to let us know if her COVID test is positive or negative. If it is negative we can bring her in office for appointment. If it is positive we will have to proceed with different treatment.  She can also do another virtual visit if she likes.

## 2020-12-20 NOTE — Telephone Encounter (Signed)
   Patient calling for advice Patient plans to take covid test today She was seen on 7/1 , still having trouble with mucus, nasal drip, sore throat Finished amoxicillin-clavulanate (AUGMENTIN) 875-125 MG tablet

## 2020-12-25 NOTE — Progress Notes (Signed)
Subjective:    Patient ID: Destiny Douglas, female    DOB: 27-Jul-1970, 50 y.o.   MRN: [id number]  HPI She is here for an acute visit / follow up of sinus infection from 12/02/20.   She completed the antibiotic - augmentin.  Felt better after a couple of days.  Then a couple of days after completing the antibiotic her symptoms restarted and have been persistent since then.  She has a persistent sore throat, dry cough, postnasal drip, rhinorrhea and intermittent ear pain.  She has not had any fevers, headaches, shortness of breath.    Medications and allergies reviewed with patient and updated if appropriate.  Patient Active Problem List   Diagnosis Date Noted   Acute sinus infection 12/02/2020   Right otitis media 12/02/2020   SVT (supraventricular tachycardia) ([place]) 05/07/2020   Family history of early CAD 11/27/2019   Atypical chest pain 11/27/2019   Pain of left lower extremity 11/27/2019   Chest discomfort 06/20/2018   SOB (shortness of breath) 06/20/2018   Arthralgia 06/20/2018   External hemorrhoids 01/29/2018   Bradycardia 06/07/2015   THYROID CANCER 01/11/2010   Hypothyroidism 02/07/2007    Current Outpatient Medications on File Prior to Visit  Medication Sig Dispense Refill   Calcium 500-125 MG-UNIT TABS Take by mouth. Take two by mouth daily.     levothyroxine (SYNTHROID) 100 MCG tablet Take 1 tablet twice a week on an empty stomach     levothyroxine (SYNTHROID, LEVOTHROID) 88 MCG tablet Take 88 mcg by mouth daily. Take 1 six days a week     liothyronine (CYTOMEL) 5 MCG tablet Take 2.5 mcg by mouth daily.     Multiple Vitamin (MULTIVITAMIN) tablet Take 1 tablet by mouth.     No current facility-administered medications on file prior to visit.    Past Medical History:  Diagnosis Date   Anxiety state, unspecified    BREAST CYST, LEFT 08/28/2007   Cellulitis of ear 10/02/2014   HYPOTHYROIDISM    post ablation for malignancy   INSOMNIA    OSTEOPENIA    Pt. denies  any bone isssues.  Has had normal bone density tests   RHINITIS    THYROID CANCER 2004   s/p ablation    Past Surgical History:  Procedure Laterality Date   BREAST SURGERY     breast biopsy, right   THYROIDECTOMY  2004   1/2 2002 and the other half 2004    Social History   Socioeconomic History   Marital status: Married    Spouse name: Not on file   Number of children: Not on file   Years of education: Not on file   Highest education level: Not on file  Occupational History   Not on file  Tobacco Use   Smoking status: Never   Smokeless tobacco: Never   Tobacco comments:    Married, lives with husband and their son. Originally from [place]; undergraduate at [employer] and [employer]  Substance and Sexual Activity   Alcohol use: Yes    Comment: occasional glass of wine   Drug use: No   Sexual activity: Yes    Birth control/protection: Condom  Other Topics Concern   Not on file  Social History Narrative   Running regularly for exercise   Social Determinants of Health   Financial Resource Strain: Not on file  Food Insecurity: Not on file  Transportation Needs: Not on file  Physical Activity: Not on file  Stress: Not on file  Social Connections: Not on file    Family History  Problem Relation Age of Onset   Breast cancer Mother    Coronary artery disease Father    Hyperlipidemia Father    Hypertension Father    Hypothyroidism Sister     Review of Systems  Constitutional:  Negative for fever.  HENT:  Positive for congestion, ear pain (intermittent), postnasal drip, rhinorrhea and sore throat. Negative for sinus pain.   Respiratory:  Positive for cough. Negative for chest tightness, shortness of breath and wheezing.   Gastrointestinal:  Negative for nausea.  Neurological:  Negative for dizziness, light-headedness and headaches.      Objective:   Vitals:   12/26/20 1451  BP: 116/72  Pulse: 69  Temp: 98.2 F (36.8 C)  SpO2: 97%    Filed Weights   12/26/20 1451  Weight: 102 lb 9.6 oz (46.5 kg)   Body mass index is 21.44 kg/m.  Wt Readings from Last 3 Encounters:  12/26/20 102 lb 9.6 oz (46.5 kg)  12/02/20 103 lb (46.7 kg)  05/04/20 103 lb 3.2 oz (46.8 kg)     Physical Exam GENERAL APPEARANCE: Appears stated age, well appearing, NAD EYES: conjunctiva clear, no icterus HEENT: bilateral tympanic membranes and ear canals normal, oropharynx with mild erythema, no thyromegaly, trachea midline, no cervical or supraclavicular lymphadenopathy LUNGS: Clear to auscultation without wheeze or crackles, unlabored breathing, good air entry bilaterally CARDIOVASCULAR: Normal S1,S2 without murmurs, no edema SKIN: warm, dry        Assessment & Plan:   See Problem List for Assessment and Plan of chronic medical problems.

## 2020-12-26 ENCOUNTER — Other Ambulatory Visit: Payer: Self-pay

## 2020-12-26 ENCOUNTER — Ambulatory Visit: Payer: No Typology Code available for payment source | Admitting: Internal Medicine

## 2020-12-26 ENCOUNTER — Encounter: Payer: Self-pay | Admitting: Internal Medicine

## 2020-12-26 DIAGNOSIS — J0100 Acute maxillary sinusitis, unspecified: Secondary | ICD-10-CM | POA: Diagnosis not present

## 2020-12-26 DIAGNOSIS — J069 Acute upper respiratory infection, unspecified: Secondary | ICD-10-CM

## 2020-12-26 HISTORY — DX: Acute upper respiratory infection, unspecified: J06.9

## 2020-12-26 MED ORDER — CEFDINIR 300 MG PO CAPS: 300.0000 mg | ORAL_CAPSULE | Freq: Two times a day (BID) | ORAL | 0 refills | Status: DC

## 2020-12-26 NOTE — Assessment & Plan Note (Signed)
Subacute Persistent-partially treated after Augmentin x10 days I agree with her that she needs another antibiotic Start Omnicef 300 mg twice daily x10 days Over-the-counter cold medication for symptom relief Rest, fluids Call if symptoms do not completely resolve or with any concerns

## 2020-12-26 NOTE — Patient Instructions (Addendum)
° ° °  Take the antibiotic as prescribed - complete the entire course.   ° ° °Continue over the counter cold medication, advil and tylenol.  Increase your fluids and rest.  ° ° °Call if no improvement   ° °

## 2020-12-28 LAB — HM MAMMOGRAPHY

## 2021-01-28 ENCOUNTER — Other Ambulatory Visit: Payer: Self-pay | Admitting: Obstetrics and Gynecology

## 2021-01-31 ENCOUNTER — Other Ambulatory Visit: Payer: Self-pay

## 2021-01-31 ENCOUNTER — Encounter (HOSPITAL_BASED_OUTPATIENT_CLINIC_OR_DEPARTMENT_OTHER): Payer: Self-pay | Admitting: Obstetrics and Gynecology

## 2021-01-31 DIAGNOSIS — Z973 Presence of spectacles and contact lenses: Secondary | ICD-10-CM

## 2021-01-31 HISTORY — DX: Presence of spectacles and contact lenses: Z97.3

## 2021-01-31 NOTE — Progress Notes (Signed)
Spoke w/ via phone for pre-op interview---pt Lab needs dos---- cbc              Lab results------see below COVID test -----patient states asymptomatic no test needed Arrive at -------630 am 02-03-2021 NPO after MN NO Solid Food.  Clear liquids from MN until---530 am Med rec completed Medications to take morning of surgery -----cystomel, levothyroxine Diabetic medication -----n/a Patient instructed no nail polish to be worn day of surgery Patient instructed to bring photo id and insurance card day of surgery Patient aware to have Driver (ride ) / caregiver   spouse michael  for 24 hours after surgery  Patient Special Instructions -----none Pre-Op special Istructions -----none Patient verbalized understanding of instructions that were given at this phone interview. Patient denies shortness of breath, chest pain, fever, cough at this phone interview.   [facility] endocrinology 08-12-2020 dr Alford [facility] Ett 02-18-2020 epic Cardiology lov dr c schumann 05-05-2020 f/u in 12 months epic Echo 03-01-2020 epic Zio patch 03-03-2020 epic

## 2021-02-03 ENCOUNTER — Ambulatory Visit (HOSPITAL_BASED_OUTPATIENT_CLINIC_OR_DEPARTMENT_OTHER)
Admission: RE | Admit: 2021-02-03 | Discharge: 2021-02-03 | Disposition: A | Discharge status: Home / Self Care | Payer: No Typology Code available for payment source | Attending: Obstetrics and Gynecology | Admitting: Obstetrics and Gynecology

## 2021-02-03 ENCOUNTER — Ambulatory Visit (HOSPITAL_BASED_OUTPATIENT_CLINIC_OR_DEPARTMENT_OTHER): Payer: No Typology Code available for payment source | Admitting: Certified Registered Nurse Anesthetist

## 2021-02-03 ENCOUNTER — Encounter (HOSPITAL_BASED_OUTPATIENT_CLINIC_OR_DEPARTMENT_OTHER): Payer: Self-pay | Admitting: Obstetrics and Gynecology

## 2021-02-03 ENCOUNTER — Encounter (HOSPITAL_BASED_OUTPATIENT_CLINIC_OR_DEPARTMENT_OTHER)
Admission: RE | Disposition: A | Discharge status: Home / Self Care | Payer: Self-pay | Source: Home / Self Care | Attending: Obstetrics and Gynecology

## 2021-02-03 DIAGNOSIS — N840 Polyp of corpus uteri: Secondary | ICD-10-CM | POA: Diagnosis not present

## 2021-02-03 DIAGNOSIS — Z8616 Personal history of COVID-19: Secondary | ICD-10-CM | POA: Diagnosis not present

## 2021-02-03 DIAGNOSIS — Z85850 Personal history of malignant neoplasm of thyroid: Secondary | ICD-10-CM | POA: Insufficient documentation

## 2021-02-03 DIAGNOSIS — Z8249 Family history of ischemic heart disease and other diseases of the circulatory system: Secondary | ICD-10-CM | POA: Insufficient documentation

## 2021-02-03 DIAGNOSIS — E890 Postprocedural hypothyroidism: Secondary | ICD-10-CM | POA: Diagnosis not present

## 2021-02-03 DIAGNOSIS — Z803 Family history of malignant neoplasm of breast: Secondary | ICD-10-CM | POA: Insufficient documentation

## 2021-02-03 DIAGNOSIS — N950 Postmenopausal bleeding: Secondary | ICD-10-CM | POA: Diagnosis not present

## 2021-02-03 HISTORY — DX: Other complications of anesthesia, initial encounter: T88.59XA

## 2021-02-03 HISTORY — PX: DILATATION & CURETTAGE/HYSTEROSCOPY WITH MYOSURE: SHX6511

## 2021-02-03 LAB — CBC
HCT: 40.3 % (ref 36.0–46.0)
Hemoglobin: 13.9 g/dL (ref 12.0–15.0)
MCH: 30.3 pg (ref 26.0–34.0)
MCHC: 34.5 g/dL (ref 30.0–36.0)
MCV: 88 fL (ref 80.0–100.0)
Platelets: 208 10*3/uL (ref 150–400)
RBC: 4.58 MIL/uL (ref 3.87–5.11)
RDW: 12.6 % (ref 11.5–15.5)
WBC: 6.2 10*3/uL (ref 4.0–10.5)
nRBC: 0 % (ref 0.0–0.2)

## 2021-02-03 SURGERY — DILATATION & CURETTAGE/HYSTEROSCOPY WITH MYOSURE
Anesthesia: General | Site: Uterus

## 2021-02-03 MED ORDER — ONDANSETRON HCL 4 MG/2ML IJ SOLN
INTRAMUSCULAR | Status: AC
  Filled 2021-02-03: qty 2

## 2021-02-03 MED ORDER — LIDOCAINE HCL (CARDIAC) PF 100 MG/5ML IV SOSY
PREFILLED_SYRINGE | INTRAVENOUS | Status: DC | PRN
  Administered 2021-02-03: 50 mg via INTRAVENOUS

## 2021-02-03 MED ORDER — PROPOFOL 10 MG/ML IV BOLUS
INTRAVENOUS | Status: DC | PRN
  Administered 2021-02-03: 100 mg via INTRAVENOUS

## 2021-02-03 MED ORDER — ACETAMINOPHEN 325 MG PO TABS: 325.0000 mg | ORAL_TABLET | ORAL | Status: DC | PRN

## 2021-02-03 MED ORDER — MIDAZOLAM HCL 5 MG/5ML IJ SOLN
INTRAMUSCULAR | Status: DC | PRN
  Administered 2021-02-03 (×2): 1 mg via INTRAVENOUS

## 2021-02-03 MED ORDER — PROPOFOL 10 MG/ML IV BOLUS
INTRAVENOUS | Status: AC
  Filled 2021-02-03: qty 20

## 2021-02-03 MED ORDER — DEXAMETHASONE SODIUM PHOSPHATE 4 MG/ML IJ SOLN
INTRAMUSCULAR | Status: DC | PRN
  Administered 2021-02-03: 5 mg via INTRAVENOUS

## 2021-02-03 MED ORDER — SCOPOLAMINE 1 MG/3DAYS TD PT72
MEDICATED_PATCH | TRANSDERMAL | Status: AC
  Filled 2021-02-03: qty 1

## 2021-02-03 MED ORDER — ACETAMINOPHEN 325 MG PO TABS
ORAL_TABLET | ORAL | Status: DC | PRN
  Administered 2021-02-03: 1000 mg via ORAL

## 2021-02-03 MED ORDER — KETOROLAC TROMETHAMINE 30 MG/ML IJ SOLN: 30.0000 mg | Freq: Once | INTRAMUSCULAR | Status: DC | PRN

## 2021-02-03 MED ORDER — FENTANYL CITRATE (PF) 100 MCG/2ML IJ SOLN: 25.0000 ug | INTRAMUSCULAR | Status: DC | PRN

## 2021-02-03 MED ORDER — ONDANSETRON HCL 4 MG/2ML IJ SOLN: 4.0000 mg | Freq: Once | INTRAMUSCULAR | Status: DC | PRN

## 2021-02-03 MED ORDER — KETOROLAC TROMETHAMINE 30 MG/ML IJ SOLN
INTRAMUSCULAR | Status: AC
  Filled 2021-02-03: qty 1

## 2021-02-03 MED ORDER — ACETAMINOPHEN 160 MG/5ML PO SOLN: 325.0000 mg | ORAL | Status: DC | PRN

## 2021-02-03 MED ORDER — MEPERIDINE HCL 25 MG/ML IJ SOLN: 6.2500 mg | INTRAMUSCULAR | Status: DC | PRN

## 2021-02-03 MED ORDER — FENTANYL CITRATE (PF) 100 MCG/2ML IJ SOLN
INTRAMUSCULAR | Status: DC | PRN
  Administered 2021-02-03: 50 ug via INTRAVENOUS

## 2021-02-03 MED ORDER — DEXAMETHASONE SODIUM PHOSPHATE 10 MG/ML IJ SOLN
INTRAMUSCULAR | Status: AC
  Filled 2021-02-03: qty 1

## 2021-02-03 MED ORDER — SODIUM CHLORIDE 0.9 % IR SOLN
Status: DC | PRN
  Administered 2021-02-03: 3000 mL

## 2021-02-03 MED ORDER — KETOROLAC TROMETHAMINE 30 MG/ML IJ SOLN
INTRAMUSCULAR | Status: DC | PRN
  Administered 2021-02-03: 30 mg via INTRAVENOUS

## 2021-02-03 MED ORDER — POVIDONE-IODINE 10 % EX SWAB: 2.0000 "application " | Freq: Once | CUTANEOUS | Status: DC

## 2021-02-03 MED ORDER — SCOPOLAMINE 1 MG/3DAYS TD PT72
MEDICATED_PATCH | TRANSDERMAL | Status: DC | PRN
  Administered 2021-02-03: 1 via TRANSDERMAL

## 2021-02-03 MED ORDER — LACTATED RINGERS IV SOLN: INTRAVENOUS | Status: DC

## 2021-02-03 MED ORDER — ONDANSETRON HCL 4 MG/2ML IJ SOLN
INTRAMUSCULAR | Status: DC | PRN
  Administered 2021-02-03: 4 mg via INTRAVENOUS

## 2021-02-03 MED ORDER — MIDAZOLAM HCL 2 MG/2ML IJ SOLN
INTRAMUSCULAR | Status: AC
  Filled 2021-02-03: qty 2

## 2021-02-03 MED ORDER — LIDOCAINE HCL (PF) 2 % IJ SOLN
INTRAMUSCULAR | Status: AC
  Filled 2021-02-03: qty 5

## 2021-02-03 MED ORDER — ACETAMINOPHEN 500 MG PO TABS
ORAL_TABLET | ORAL | Status: AC
  Filled 2021-02-03: qty 2

## 2021-02-03 MED ORDER — OXYCODONE HCL 5 MG PO TABS: 5.0000 mg | ORAL_TABLET | Freq: Once | ORAL | Status: DC | PRN

## 2021-02-03 MED ORDER — FENTANYL CITRATE (PF) 100 MCG/2ML IJ SOLN
INTRAMUSCULAR | Status: AC
  Filled 2021-02-03: qty 2

## 2021-02-03 MED ORDER — OXYCODONE HCL 5 MG/5ML PO SOLN: 5.0000 mg | Freq: Once | ORAL | Status: DC | PRN

## 2021-02-03 SURGICAL SUPPLY — 20 items
CATH ROBINSON RED A/P 16FR (CATHETERS) IMPLANT
DEVICE MYOSURE LITE (MISCELLANEOUS) IMPLANT
DEVICE MYOSURE REACH (MISCELLANEOUS) ×2 IMPLANT
DILATOR CANAL MILEX (MISCELLANEOUS) IMPLANT
GAUZE 4X4 16PLY ~~LOC~~+RFID DBL (SPONGE) ×4 IMPLANT
GLOVE SURG LTX SZ6.5 (GLOVE) ×2 IMPLANT
GLOVE SURG UNDER POLY LF SZ7 (GLOVE) ×2 IMPLANT
GOWN STRL REUS W/TWL LRG LVL3 (GOWN DISPOSABLE) ×2 IMPLANT
IV NS IRRIG 3000ML ARTHROMATIC (IV SOLUTION) ×2 IMPLANT
KIT PROCEDURE FLUENT (KITS) ×2 IMPLANT
KIT TURNOVER CYSTO (KITS) ×2 IMPLANT
MYOSURE XL FIBROID (MISCELLANEOUS)
PACK VAGINAL MINOR WOMEN LF (CUSTOM PROCEDURE TRAY) ×2 IMPLANT
PAD OB MATERNITY 4.3X12.25 (PERSONAL CARE ITEMS) ×2 IMPLANT
PAD PREP 24X48 CUFFED NSTRL (MISCELLANEOUS) ×2 IMPLANT
SEAL CERVICAL OMNI LOK (ABLATOR) IMPLANT
SEAL ROD LENS SCOPE MYOSURE (ABLATOR) ×2 IMPLANT
SYSTEM TISS REMOVAL MYOSURE XL (MISCELLANEOUS) IMPLANT
TOWEL OR 17X26 10 PK STRL BLUE (TOWEL DISPOSABLE) ×2 IMPLANT
WATER STERILE IRR 500ML POUR (IV SOLUTION) ×2 IMPLANT

## 2021-02-03 NOTE — Discharge Instructions (Addendum)
CALL  IF TEMP>100.4, NOTHING PER VAGINA X 1 WK, CALL IF SOAKING A MAXI  PAD EVERY HOUR OR MORE FREQUENTLY DISCHARGE INSTRUCTIONS: D&C / D&E The following instructions have been prepared to help you care for yourself upon your return home.   Personal hygiene:  Use sanitary pads for vaginal drainage, not tampons.  Shower the day after your procedure.  NO tub baths, pools or Jacuzzis for 2-3 weeks.  Wipe front to back after using the bathroom.  Activity and limitations:  Do NOT drive or operate any equipment for 24 hours. The effects of anesthesia are still present and drowsiness may result.  Do NOT rest in bed all day.  Walking is encouraged.  Walk up and down stairs slowly.  You may resume your normal activity in one to two days or as indicated by your physician.  Sexual activity: NO intercourse for at least 2 weeks after the procedure, or as indicated by your physician.  Diet: Eat a light meal as desired this evening. You may resume your usual diet tomorrow.  Return to work: You may resume your work activities in one to two days or as indicated by your doctor.  What to expect after your surgery: Expect to have vaginal bleeding/discharge for 2-3 days and spotting for up to 10 days. It is not unusual to have soreness for up to 1-2 weeks. You may have a slight burning sensation when you urinate for the first day. Mild cramps may continue for a couple of days. You may have a regular period in 2-6 weeks.  Call your doctor for any of the following:  Excessive vaginal bleeding, saturating and changing one pad every hour.  Inability to urinate 6 hours after discharge from hospital.  Pain not relieved by pain medication.  Fever of 100.4 F or greater.  Unusual vaginal discharge or odor.   Call for an appointment:      Post Anesthesia Home Care Instructions  Activity: Get plenty of rest for the remainder of the day. A responsible adult should stay with you for 24 hours following the  procedure.  For the next 24 hours, DO NOT: -Drive a car -Paediatric nurse -Drink alcoholic beverages -Take any medication unless instructed by your physician -Make any legal decisions or sign important papers.  Meals: Start with liquid foods such as gelatin or soup. Progress to regular foods as tolerated. Avoid greasy, spicy, heavy foods. If nausea and/or vomiting occur, drink only clear liquids until the nausea and/or vomiting subsides. Call your physician if vomiting continues.  Special Instructions/Symptoms: Your throat may feel dry or sore from the anesthesia or the breathing tube placed in your throat during surgery. If this causes discomfort, gargle with warm salt water. The discomfort should disappear within 24 hours.  If you had a scopolamine patch placed behind your ear for the management of post- operative nausea and/or vomiting:  1. The medication in the patch is effective for 72 hours, after which it should be removed.  Wrap patch in a tissue and discard in the trash. Wash hands thoroughly with soap and water. 2. You may remove the patch earlier than 72 hours if you experience unpleasant side effects which may include dry mouth, dizziness or visual disturbances. 3. Avoid touching the patch. Wash your hands with soap and water after contact with the patch.

## 2021-02-03 NOTE — Anesthesia Preprocedure Evaluation (Addendum)
Anesthesia Evaluation  Patient identified by MRN, date of birth, ID band Patient awake    History of Anesthesia Complications (+) PONV and history of anesthetic complications  Airway Mallampati: I       Dental no notable dental hx.    Pulmonary neg pulmonary ROS,    Pulmonary exam normal        Cardiovascular Normal cardiovascular exam     Neuro/Psych negative neurological ROS  negative psych ROS   GI/Hepatic negative GI ROS, Neg liver ROS,   Endo/Other  negative endocrine ROSHypothyroidism   Renal/GU negative Renal ROS  negative genitourinary   Musculoskeletal negative musculoskeletal ROS (+)   Abdominal Normal abdominal exam  (+)   Peds  Hematology negative hematology ROS (+)   Anesthesia Other Findings   Reproductive/Obstetrics                            Anesthesia Physical Anesthesia Plan  ASA: 2  Anesthesia Plan: General   Post-op Pain Management:    Induction: Intravenous  PONV Risk Score and Plan: Ondansetron, Dexamethasone, Scopolamine patch - Pre-op and Midazolam  Airway Management Planned: LMA  Additional Equipment: None  Intra-op Plan:   Post-operative Plan: Extubation in OR  Informed Consent: I have reviewed the patients History and Physical, chart, labs and discussed the procedure including the risks, benefits and alternatives for the proposed anesthesia with the patient or authorized representative who has indicated his/her understanding and acceptance.     Dental advisory given  Plan Discussed with: CRNA  Anesthesia Plan Comments:         Anesthesia Quick Evaluation

## 2021-02-03 NOTE — Anesthesia Procedure Notes (Signed)
Procedure Name: LMA Insertion Date/Time: 02/03/2021 8:30 AM Performed by: Bufford Spikes, CRNA Pre-anesthesia Checklist: Patient identified, Emergency Drugs available, Suction available and Patient being monitored Patient Re-evaluated:Patient Re-evaluated prior to induction Oxygen Delivery Method: Circle system utilized Preoxygenation: Pre-oxygenation with 100% oxygen Induction Type: IV induction Ventilation: Mask ventilation without difficulty LMA: LMA inserted LMA Size: 3.0 Number of attempts: 1 Placement Confirmation: positive ETCO2 Tube secured with: Tape Dental Injury: Teeth and Oropharynx as per pre-operative assessment

## 2021-02-03 NOTE — Interval H&P Note (Signed)
History and Physical Interval Note:  02/03/2021 8:18 AM  Destiny Douglas  has presented today for surgery, with the diagnosis of POST  MENOPAUSAL BLEEDING.  The various methods of treatment have been discussed with the patient and family. After consideration of risks, benefits and other options for treatment, the patient has consented to DIAGNOSTIC HYSTEROSCOPY,  [place] a surgical intervention.  The patient's history has been reviewed, patient examined, no change in status, stable for surgery.  I have reviewed the patient's chart and labs.  Questions were answered to the patient's satisfaction.     Kissa Campoy A Shayde Gervacio

## 2021-02-03 NOTE — H&P (Signed)
Destiny Douglas is an 51 y.o. female. MWF PMP not on HRT presents for evaluation of  recurrent PMB. Pt is dcheduled for Dx hysteroscopy, D&C due to sonogram revealing endometrial stripe less than 4 mm with recurrence of vaginal bleeding  Pertinent Gynecological History: Menses: post-menopausal Bleeding: post menopausal bleeding Contraception: none DES exposure: denies Blood transfusions: none Sexually transmitted diseases: no past history Previous GYN Procedures:  none   Last mammogram: normal Date: 2022 Last pap: normal Date: 2022 OB History: G1, P1   Menstrual History: Menarche age: n/a Patient's last menstrual period was 09/10/2014.    Past Medical History:  Diagnosis Date   BREAST CYST, LEFT [id number]   Complication of anesthesia    slow to awaken with 1 thryoid surgery   COVID 03/2020   mild symptoms x 2 weeks all symptoms reoslved   HYPOTHYROIDISM    post ablation for malignancy   OSTEOPENIA    Pt. denies any bone isssues.  Has had normal bone density tests   RHINITIS    THYROID CANCER 06/04/2002   s/p ablation   URI (upper respiratory infection) 12/26/2020   all symptoms reolved pe rpt   Wears glasses 01/31/2021    Past Surgical History:  Procedure Laterality Date   BREAST SURGERY     breast biopsy, right yrs ago per pt on 01-31-2021   THYROIDECTOMY  06/04/2002   1/2 2002 and the other half 2004    Family History  Problem Relation Age of Onset   Breast cancer Mother    Coronary artery disease Father    Hyperlipidemia Father    Hypertension Father    Hypothyroidism Sister     Social History:  reports that she has never smoked. She has never used smokeless tobacco. She reports current alcohol use. She reports that she does not use drugs.  Allergies:  Allergies  Allergen Reactions   Lactose Intolerance (Gi)     No medications prior to admission.    Review of Systems  All other systems reviewed and are negative.  Height '4\' 10"'$  (1.473 m), weight  46.3 kg, last menstrual period 09/10/2014. Physical Exam Constitutional:      Appearance: Normal appearance.  Eyes:     Extraocular Movements: Extraocular movements intact.  Cardiovascular:     Rate and Rhythm: Regular rhythm.     Heart sounds: Normal heart sounds.  Pulmonary:     Breath sounds: Normal breath sounds.  Abdominal:     Palpations: Abdomen is soft.  Genitourinary:    General: Normal vulva.     Comments: Vagina; atropic pale mucosa Cervix parous  Uterus small Adnexa nl Musculoskeletal:        General: Normal range of motion.     Cervical back: Neck supple.  Skin:    General: Skin is warm and dry.  Neurological:     Mental Status: She is alert.  Psychiatric:        Mood and Affect: Mood normal.        Behavior: Behavior normal.    No results found for this or any previous visit (from the past 24 hour(s)).  No results found.  Assessment/Plan: Recurrent Postmenopausal bleeding P) dx hysteroscopy, D&C. Procedure explained. Risk of surgery reviewed including infection, bleeding, injury to surrounding organ structures, fluid overload and its risk, thermal injury, uterine perforation( 06/998) and its risk. All ? answered  Destiny Douglas A Destiny Douglas 02/03/2021, 4:13 AM

## 2021-02-03 NOTE — Brief Op Note (Signed)
02/03/2021  9:15 AM  PATIENT:  Destiny Douglas  50 y.o. female  PRE-OPERATIVE DIAGNOSIS:  recurrent  POSTMENOPAUSAL BLEEDING  POST-OPERATIVE DIAGNOSIS:  recurrent POSTMENOPAUSAL BLEEDING, endometrial and endocervical polyps  PROCEDURE:  diagnostic hysteroscopy, hysteroscopic resection of endometrial and endocervical polyps using myosure, dilation and curettage  SURGEON:  Surgeon(s) and Role:    * Servando Salina, MD - Primary  PHYSICIAN ASSISTANT:   ASSISTANTS: none   ANESTHESIA:   general FINDINGS; multiple endometrial polyps, endocervical polyp, sclerosed tubal ostia, atropic endometrium EBL:  3 ml   BLOOD ADMINISTERED:none  DRAINS: none   LOCAL MEDICATIONS USED:  NONE  SPECIMEN:  Source of Specimen:  EMC with endometrial polyps  DISPOSITION OF SPECIMEN:  PATHOLOGY  COUNTS:  YES  TOURNIQUET:  * No tourniquets in log *  DICTATION: .Other Dictation: Dictation Number [id number]  PLAN OF CARE: Discharge to home after PACU  PATIENT DISPOSITION:  PACU - hemodynamically stable.   Delay start of Pharmacological VTE agent (>24hrs) due to surgical blood loss or risk of bleeding: no

## 2021-02-03 NOTE — Transfer of Care (Signed)
Immediate Anesthesia Transfer of Care Note  Patient: Destiny Douglas  Procedure(s) Performed: DILATATION & CURETTAGE/HYSTEROSCOPY WITH MYOSURE (Uterus)  Patient Location: PACU  Anesthesia Type:General  Level of Consciousness: awake, alert  and oriented  Airway & Oxygen Therapy: Patient Spontanous Breathing and Patient connected to nasal cannula oxygen  Post-op Assessment: Report given to RN and Post -op Vital signs reviewed and stable  Post vital signs: Reviewed and stable  Last Vitals:  Vitals Value Taken Time  BP 123/75 02/03/21 0902  Temp    Pulse 78 02/03/21 0904  Resp 9 02/03/21 0904  SpO2 100 % 02/03/21 0904  Vitals shown include unvalidated device data.  Last Pain:  Vitals:   02/03/21 0719  TempSrc: Oral  PainSc: 0-No pain      Patients Stated Pain Goal: 5 ([id number])  Complications: No notable events documented.

## 2021-02-03 NOTE — Anesthesia Postprocedure Evaluation (Signed)
Anesthesia Post Note  Patient: Destiny Douglas  Procedure(s) Performed: DILATATION & CURETTAGE/HYSTEROSCOPY WITH MYOSURE (Uterus)     Patient location during evaluation: PACU Anesthesia Type: General Level of consciousness: awake Pain management: pain level controlled Vital Signs Assessment: post-procedure vital signs reviewed and stable Respiratory status: spontaneous breathing Cardiovascular status: stable Postop Assessment: no apparent nausea or vomiting Anesthetic complications: no   No notable events documented.  Last Vitals:  Vitals:   02/03/21 0913 02/03/21 0915  BP:  135/86  Pulse: 71 61  Resp: 11 12  Temp: (!) 36.3 C   SpO2: 100% 100%    Last Pain:  Vitals:   02/03/21 0915  TempSrc:   PainSc: 0-No pain                 Huston Foley

## 2021-02-03 NOTE — Op Note (Signed)
Destiny Douglas, Destiny Douglas MEDICAL RECORD NO: [id number] ACCOUNT NO: [id number] DATE OF BIRTH: 08-Oct-1970 FACILITY: [facility] LOCATION: [facility] PHYSICIAN: Davina Howlett A. Garwin Brothers, MD  Operative Report   DATE OF PROCEDURE: 02/03/2021  POSTOPERATIVE DIAGNOSIS:  Recurrent postmenopausal bleeding.  PROCEDURE PERFORMED:  Diagnostic hysteroscopy, hysteroscopic resection of endometrial and endocervical polyps using MyoSure, dilation and curettage.  POSTOPERATIVE DIAGNOSES:  Recurrent postmenopausal bleeding, endometrial and endocervical polyp.  ANESTHESIA:  General.  SURGEON:  Servando Salina, MD  ASSISTANT:  None.  DESCRIPTION OF PROCEDURE:  Under adequate general anesthesia, the patient was placed in the dorsal lithotomy position.  She was sterilely prepped and draped in the usual fashion.  The patient had voided prior to entering the room.  Therefore, she was not  catheterized.  Examination under anesthesia revealed anteverted uterus.  No adnexal masses could be appreciated.  Bivalve speculum was placed in the vagina.  The cervix was slightly flushed.  The anterior lip was grasped with a single tooth tenaculum.   The cervix easily accepted a #19 Pratt dilator.  A diagnostic hysteroscope was introduced into the uterine cavity.  On entering the endocervical canal, there was a small polypoid lesion noted. Entering into the uterine cavity, there were several  endometrial polyps noted.  Both tubal ostia were seen and appeared sclerosed. Using the REACH resectoscope, the entire endometrial cavity was resected including the polypoid lesions and endocervical polypoid lesion.  When all tissue was felt to have been  removed, all instruments were then removed from the vagina.  Specimen labeled endometrial curettings with endometrial polyps were sent to pathology.  Estimated blood loss was 3 ml.  Fluid deficit was 50 mL. Complication was none.  The patient  tolerated the procedure well and was transferred to  recovery room in stable condition.   SHW D: 02/03/2021 9:14:38 am T: 02/03/2021 9:18:00 pm  JOB: [id number]

## 2021-02-07 LAB — SURGICAL PATHOLOGY

## 2021-02-08 ENCOUNTER — Encounter (HOSPITAL_BASED_OUTPATIENT_CLINIC_OR_DEPARTMENT_OTHER): Payer: Self-pay | Admitting: Obstetrics and Gynecology

## 2021-02-09 ENCOUNTER — Encounter: Payer: Self-pay | Admitting: Internal Medicine

## 2021-02-09 NOTE — Progress Notes (Signed)
Outside notes received. Information abstracted. Notes sent to scan.  

## 2021-07-05 ENCOUNTER — Encounter: Payer: Self-pay | Admitting: Internal Medicine

## 2021-07-05 NOTE — Patient Instructions (Addendum)
Blood work was ordered.     Medications changes include :   None   Please followup in 1 year   Health Maintenance, Female Adopting a healthy lifestyle and getting preventive care are important in promoting health and wellness. Ask your health care provider about: The right schedule for you to have regular tests and exams. Things you can do on your own to prevent diseases and keep yourself healthy. What should I know about diet, weight, and exercise? Eat a healthy diet  Eat a diet that includes plenty of vegetables, fruits, low-fat dairy products, and lean protein. Do not eat a lot of foods that are high in solid fats, added sugars, or sodium. Maintain a healthy weight Body mass index (BMI) is used to identify weight problems. It estimates body fat based on height and weight. Your health care provider can help determine your BMI and help you achieve or maintain a healthy weight. Get regular exercise Get regular exercise. This is one of the most important things you can do for your health. Most adults should: Exercise for at least 150 minutes each week. The exercise should increase your heart rate and make you sweat (moderate-intensity exercise). Do strengthening exercises at least twice a week. This is in addition to the moderate-intensity exercise. Spend less time sitting. Even light physical activity can be beneficial. Watch cholesterol and blood lipids Have your blood tested for lipids and cholesterol at 51 years of age, then have this test every 5 years. Have your cholesterol levels checked more often if: Your lipid or cholesterol levels are high. You are older than 51 years of age. You are at high risk for heart disease. What should I know about cancer screening? Depending on your health history and family history, you may need to have cancer screening at various ages. This may include screening for: Breast cancer. Cervical cancer. Colorectal cancer. Skin cancer. Lung  cancer. What should I know about heart disease, diabetes, and high blood pressure? Blood pressure and heart disease High blood pressure causes heart disease and increases the risk of stroke. This is more likely to develop in people who have high blood pressure readings or are overweight. Have your blood pressure checked: Every 3-5 years if you are 51-10 years of age. Every year if you are 42 years old or older. Diabetes Have regular diabetes screenings. This checks your fasting blood sugar level. Have the screening done: Once every three years after age 51 if you are at a normal weight and have a low risk for diabetes. More often and at a younger age if you are overweight or have a high risk for diabetes. What should I know about preventing infection? Hepatitis B If you have a higher risk for hepatitis B, you should be screened for this virus. Talk with your health care provider to find out if you are at risk for hepatitis B infection. Hepatitis C Testing is recommended for: Everyone born from [birth year] through 1965. Anyone with known risk factors for hepatitis C. Sexually transmitted infections (STIs) Get screened for STIs, including gonorrhea and chlamydia, if: You are sexually active and are younger than 51 years of age. You are older than 51 years of age and your health care provider tells you that you are at risk for this type of infection. Your sexual activity has changed since you were last screened, and you are at increased risk for chlamydia or gonorrhea. Ask your health care provider if you are at risk. Ask  your health care provider about whether you are at high risk for HIV. Your health care provider may recommend a prescription medicine to help prevent HIV infection. If you choose to take medicine to prevent HIV, you should first get tested for HIV. You should then be tested every 3 months for as long as you are taking the medicine. Pregnancy If you are about to stop having your  period (premenopausal) and you may become pregnant, seek counseling before you get pregnant. Take 400 to 800 micrograms (mcg) of folic acid every day if you become pregnant. Ask for birth control (contraception) if you want to prevent pregnancy. Osteoporosis and menopause Osteoporosis is a disease in which the bones lose minerals and strength with aging. This can result in bone fractures. If you are 51 years old or older, or if you are at risk for osteoporosis and fractures, ask your health care provider if you should: Be screened for bone loss. Take a calcium or vitamin D supplement to lower your risk of fractures. Be given hormone replacement therapy (HRT) to treat symptoms of menopause. Follow these instructions at home: Alcohol use Do not drink alcohol if: Your health care provider tells you not to drink. You are pregnant, may be pregnant, or are planning to become pregnant. If you drink alcohol: Limit how much you have to: 0-1 drink a day. Know how much alcohol is in your drink. In the [place] one drink equals one 12 oz bottle of beer (355 mL), one 5 oz glass of wine (148 mL), or one 1 oz glass of hard liquor (44 mL). Lifestyle Do not use any products that contain nicotine or tobacco. These products include cigarettes, chewing tobacco, and vaping devices, such as e-cigarettes. If you need help quitting, ask your health care provider. Do not use street drugs. Do not share needles. Ask your health care provider for help if you need support or information about quitting drugs. General instructions Schedule regular health, dental, and eye exams. Stay current with your vaccines. Tell your health care provider if: You often feel depressed. You have ever been abused or do not feel safe at home. Summary Adopting a healthy lifestyle and getting preventive care are important in promoting health and wellness. Follow your health care provider's instructions about healthy diet, exercising, and  getting tested or screened for diseases. Follow your health care provider's instructions on monitoring your cholesterol and blood pressure. This information is not intended to replace advice given to you by your health care provider. Make sure you discuss any questions you have with your health care provider. Document Revised: 10/10/2020 Document Reviewed: 10/10/2020 Elsevier Patient Education  [place].

## 2021-07-05 NOTE — Progress Notes (Signed)
Subjective:    Patient ID: Destiny Douglas, female    DOB: Jul 16, 1970, 51 y.o.   MRN: [id number]   This visit occurred during the SARS-CoV-2 public health emergency.  Safety protocols were in place, including screening questions prior to the visit, additional usage of staff PPE, and extensive cleaning of exam room while observing appropriate contact time as indicated for disinfecting solutions.    HPI She is here for a physical exam.   New red freckle under right eye.  She is having hot flashes related to menopause.  They have improved a little bit with decreasing her thyroid medication dose.  She follows with endocrine.  Medications and allergies reviewed with patient and updated if appropriate.  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 Take two by mouth daily.     levothyroxine (SYNTHROID) 75 MCG tablet Take 1 tablet (75 mcg) one day a week, 88 mcg tablets the other 6 days of the week-NAME BRAND SYNTHROID     liothyronine (CYTOMEL) 5 MCG tablet Take 2 tablets by mouth daily.     Multiple Vitamin (MULTIVITAMIN) tablet Take 1 tablet by mouth.     No current facility-administered medications on file prior to visit.    Past Medical History:  Diagnosis Date   BREAST CYST, LEFT 53/29/9242   Complication of anesthesia    slow to awaken with 1 thryoid surgery   COVID 03/2020   mild symptoms x 2 weeks all symptoms reoslved   HYPOTHYROIDISM    post ablation for malignancy   OSTEOPENIA    Pt. denies any bone isssues.  Has had normal bone density tests   RHINITIS    THYROID CANCER 06/04/2002    s/p ablation   URI (upper respiratory infection) 12/26/2020   all symptoms reolved pe rpt   Wears glasses 01/31/2021    Past Surgical History:  Procedure Laterality Date   BREAST SURGERY     breast biopsy, right yrs ago per pt on 01-31-2021   DILATATION & CURETTAGE/HYSTEROSCOPY WITH MYOSURE N/A 02/03/2021   Procedure: [place];  Surgeon: Servando Salina, MD;  Location: [facility];  Service: Gynecology;  Laterality: N/A;   THYROIDECTOMY  06/04/2002   1/2 2002 and the other half 2004    Social History   Socioeconomic History   Marital status: Married    Spouse name: Not on file   Number of children: Not on file   Years of education: Not on file   Highest education level: Not on file  Occupational History   Not on file  Tobacco Use   Smoking status: Never   Smokeless tobacco: Never   Tobacco comments:    Married, lives with husband and their son. Originally from [place]; undergraduate at [employer] and [employer]  Substance and Sexual Activity   Alcohol use: Yes    Comment: occasional glass of wine   Drug use: No   Sexual activity: Yes    Birth control/protection: Condom  Other Topics Concern   Not on file  Social History Narrative   Running regularly for  exercise   Social Determinants of Health   Financial Resource Strain: Not on file  Food Insecurity: Not on file  Transportation Needs: Not on file  Physical Activity: Not on file  Stress: Not on file  Social Connections: Not on file    Family History  Problem Relation Age of Onset   Other Mother 31       Endometriosis with adhesions on fallopian tubes   Breast cancer Mother    Coronary artery disease Father    Hyperlipidemia Father    Hypertension Father    Hypothyroidism Sister     Review of Systems  Constitutional:  Negative for chills and fever.       Hot flashes  Eyes:  Negative for visual disturbance.  Respiratory:   Negative for cough, shortness of breath and wheezing.   Cardiovascular:  Negative for chest pain, palpitations and leg swelling.  Gastrointestinal:  Negative for abdominal pain, blood in stool, constipation, diarrhea and nausea.       No gerd  Genitourinary:  Negative for dysuria.  Musculoskeletal:  Negative for arthralgias and back pain.  Skin:  Negative for rash.  Neurological:  Negative for dizziness, light-headedness and headaches.  Psychiatric/Behavioral:  Negative for dysphoric mood. The patient is not nervous/anxious.       Objective:   Vitals:   07/06/21 0919  BP: 120/76  Pulse: 80  Temp: 98 F (36.7 C)  SpO2: 98%   Filed Weights   07/06/21 0919  Weight: 108 lb (49 kg)   Body mass index is 22.57 kg/m.  BP Readings from Last 3 Encounters:  07/06/21 120/76  02/03/21 120/86  12/26/20 116/72    Wt Readings from Last 3 Encounters:  07/06/21 108 lb (49 kg)  02/03/21 105 lb 11.2 oz (47.9 kg)  12/26/20 102 lb 9.6 oz (46.5 kg)    Depression screen [facility] 2/9 12/02/2020 01/29/2018 03/02/2013  Decreased Interest 0 0 0  Down, Depressed, Hopeless 0 0 0  PHQ - 2 Score 0 0 0  Altered sleeping - 1 -  Tired, decreased energy - 1 -  Change in appetite - 0 -  Feeling bad or failure about yourself  - 0 -  Trouble concentrating - 0 -  Moving slowly or fidgety/restless - 0 -  Suicidal thoughts - 0 -  PHQ-9 Score - 2 -     GAD 7 : Generalized Anxiety Score 01/29/2018  Nervous, Anxious, on Edge 0  Control/stop worrying 0  Worry too much - different things 0  Trouble relaxing 0  Restless 0  Easily annoyed or irritable 0  Afraid - awful might happen 0  Total GAD 7 Score 0       Physical Exam Constitutional: She appears well-developed and well-nourished. No distress.  HENT:  Head: Normocephalic and atraumatic.  Right Ear: External ear normal. Normal ear canal and TM Left Ear: External ear normal.  Normal ear canal and TM Mouth/Throat: Oropharynx is clear and moist.   Eyes: Conjunctivae and EOM are normal.  Neck: Neck supple. No tracheal deviation present. No thyromegaly present.  No carotid bruit  Cardiovascular: Normal rate, regular rhythm and normal heart sounds.   No murmur heard.  No edema. Pulmonary/Chest: Effort normal and breath sounds normal. No respiratory distress. She has no wheezes. She has no rales.  Breast: deferred   Abdominal: Soft. She exhibits no distension. There is no tenderness.  Lymphadenopathy: She has no cervical adenopathy.  Skin: Skin is warm and dry. She is not  diaphoretic.  Psychiatric: She has a normal mood and affect. Her behavior is normal.     Lab Results  Component Value Date   WBC 6.2 02/03/2021   HGB 13.9 02/03/2021   HCT 40.3 02/03/2021   PLT 208 02/03/2021   GLUCOSE 98 06/20/2018   CHOL 155 01/29/2018   TRIG 108.0 01/29/2018   HDL 56.50 01/29/2018   LDLCALC 77 01/29/2018   ALT 19 06/20/2018   AST 18 06/20/2018   NA 141 06/20/2018   K 4.1 06/20/2018   CL 102 06/20/2018   CREATININE 0.78 06/20/2018   BUN 14 06/20/2018   CO2 29 06/20/2018   TSH <0.01 (L) 06/20/2018   HGBA1C 5.2 06/14/2014         Assessment & Plan:   Physical exam: Screening blood work  ordered Exercise  regular Weight  normal Substance abuse  none   Reviewed recommended immunizations.   Health Maintenance  Topic Date Due   Zoster Vaccines- Shingrix (1 of 2) Never done   COLONOSCOPY (Pts 45-13yrs Insurance coverage will need to be confirmed)  Never done   Hepatitis C Screening  12/02/2021 (Originally 05/26/1989)   MAMMOGRAM  12/28/2021   PAP SMEAR-Modifier  02/14/2022   TETANUS/TDAP  07/07/2031   INFLUENZA VACCINE  Completed   COVID-19 Vaccine  Completed   HIV Screening  Completed   HPV VACCINES  Aged Out          See Problem List for Assessment and Plan of chronic medical problems.

## 2021-07-06 ENCOUNTER — Ambulatory Visit (INDEPENDENT_AMBULATORY_CARE_PROVIDER_SITE_OTHER): Payer: No Typology Code available for payment source | Admitting: Internal Medicine

## 2021-07-06 ENCOUNTER — Other Ambulatory Visit: Payer: Self-pay

## 2021-07-06 VITALS — BP 120/76 | HR 80 | Temp 98.0°F | Ht <= 58 in | Wt 108.0 lb

## 2021-07-06 DIAGNOSIS — Z23 Encounter for immunization: Secondary | ICD-10-CM

## 2021-07-06 DIAGNOSIS — Z85850 Personal history of malignant neoplasm of thyroid: Secondary | ICD-10-CM

## 2021-07-06 DIAGNOSIS — E890 Postprocedural hypothyroidism: Secondary | ICD-10-CM | POA: Diagnosis not present

## 2021-07-06 DIAGNOSIS — Z1211 Encounter for screening for malignant neoplasm of colon: Secondary | ICD-10-CM

## 2021-07-06 DIAGNOSIS — Z0000 Encounter for general adult medical examination without abnormal findings: Secondary | ICD-10-CM | POA: Diagnosis not present

## 2021-07-06 LAB — COMPREHENSIVE METABOLIC PANEL
ALT: 24 U/L (ref 0–35)
AST: 25 U/L (ref 0–37)
Albumin: 4.7 g/dL (ref 3.5–5.2)
Alkaline Phosphatase: 80 U/L (ref 39–117)
BUN: 16 mg/dL (ref 6–23)
CO2: 30 mEq/L (ref 19–32)
Calcium: 10.1 mg/dL (ref 8.4–10.5)
Chloride: 103 mEq/L (ref 96–112)
Creatinine, Ser: 0.79 mg/dL (ref 0.40–1.20)
GFR: 87.41 mL/min (ref >60.00)
Glucose, Bld: 86 mg/dL (ref 70–99)
Potassium: 4.5 mEq/L (ref 3.5–5.1)
Sodium: 141 mEq/L (ref 135–145)
Total Bilirubin: 0.6 mg/dL (ref 0.2–1.2)
Total Protein: 7.5 g/dL (ref 6.0–8.3)

## 2021-07-06 LAB — CBC WITH DIFFERENTIAL/PLATELET
Basophils Absolute: 0 10*3/uL (ref 0.0–0.1)
Basophils Relative: 0.5 % (ref 0.0–3.0)
Eosinophils Absolute: 0.2 10*3/uL (ref 0.0–0.7)
Eosinophils Relative: 3 % (ref 0.0–5.0)
HCT: 44.7 % (ref 36.0–46.0)
Hemoglobin: 15.2 g/dL — ABNORMAL HIGH (ref 12.0–15.0)
Lymphocytes Relative: 30.3 % (ref 12.0–46.0)
Lymphs Abs: 2.2 10*3/uL (ref 0.7–4.0)
MCHC: 34 g/dL (ref 30.0–36.0)
MCV: 86.2 fl (ref 78.0–100.0)
Monocytes Absolute: 0.4 10*3/uL (ref 0.1–1.0)
Monocytes Relative: 5.6 % (ref 3.0–12.0)
Neutro Abs: 4.3 10*3/uL (ref 1.4–7.7)
Neutrophils Relative %: 60.6 % (ref 43.0–77.0)
Platelets: 248 10*3/uL (ref 150.0–400.0)
RBC: 5.19 Mil/uL — ABNORMAL HIGH (ref 3.87–5.11)
RDW: 12.4 % (ref 11.5–15.5)
WBC: 7.1 10*3/uL (ref 4.0–10.5)

## 2021-07-06 LAB — LIPID PANEL
Cholesterol: 195 mg/dL (ref 0–200)
HDL: 66 mg/dL (ref >39.00)
LDL Cholesterol: 109 mg/dL — ABNORMAL HIGH (ref 0–99)
NonHDL: 128.95
Total CHOL/HDL Ratio: 3
Triglycerides: 102 mg/dL (ref 0.0–149.0)
VLDL: 20.4 mg/dL (ref 0.0–40.0)

## 2021-07-06 NOTE — Assessment & Plan Note (Signed)
Following with endocrine

## 2021-07-06 NOTE — Assessment & Plan Note (Signed)
Chronic Management per endocrine Clinically euthyroid

## 2021-08-21 ENCOUNTER — Other Ambulatory Visit: Payer: Self-pay

## 2021-08-21 ENCOUNTER — Ambulatory Visit (AMBULATORY_SURGERY_CENTER): Payer: No Typology Code available for payment source | Admitting: *Deleted

## 2021-08-21 VITALS — Ht <= 58 in | Wt 105.0 lb

## 2021-08-21 DIAGNOSIS — Z1211 Encounter for screening for malignant neoplasm of colon: Secondary | ICD-10-CM

## 2021-08-21 MED ORDER — PLENVU 140 G PO SOLR
1.0000 | Freq: Once | ORAL | 0 refills | Status: AC
Start: 2021-08-21 — End: 2021-08-21

## 2021-08-21 NOTE — Progress Notes (Signed)
No egg or soy allergy known to patient  ?No issues known to pt with past sedation with any surgeries or procedures ?Patient denies ever being told they had issues or difficulty with intubation  ?No FH of Malignant Hyperthermia ?Pt is not on diet pills ?Pt is not on  home 02  ?Pt is not on blood thinners  ?Pt denies issues with constipation  ?No A fib or A flutter ? ?Pt is fully vaccinated  for Covid  ? ?PLENVU Coupon to pt in PV today , Code to Pharmacy and  NO PA's for preps discussed with pt In PV today. ? ? ?Due to the COVID-19 pandemic we are asking patients to follow certain guidelines in PV and the [facility]   ?Pt aware of COVID protocols and [facility] guidelines  ? ?PV completed over the phone. Pt verified name, DOB, address and insurance during PV today.  ?Pt mailed instruction packet with copy of consent form to read and not return, and instructions.  ?Pt encouraged to call with questions or issues.  ?If pt has My chart, procedure instructions sent via My Chart   ?

## 2021-08-29 ENCOUNTER — Encounter: Payer: Self-pay | Admitting: Certified Registered Nurse Anesthetist

## 2021-09-02 ENCOUNTER — Encounter: Payer: Self-pay | Admitting: Gastroenterology

## 2021-09-07 ENCOUNTER — Ambulatory Visit (AMBULATORY_SURGERY_CENTER): Payer: No Typology Code available for payment source | Admitting: Gastroenterology

## 2021-09-07 ENCOUNTER — Encounter: Payer: Self-pay | Admitting: Gastroenterology

## 2021-09-07 VITALS — BP 111/64 | HR 77 | Temp 97.8°F | Resp 13 | Ht <= 58 in | Wt 105.0 lb

## 2021-09-07 DIAGNOSIS — D124 Benign neoplasm of descending colon: Secondary | ICD-10-CM

## 2021-09-07 DIAGNOSIS — K635 Polyp of colon: Secondary | ICD-10-CM

## 2021-09-07 DIAGNOSIS — K621 Rectal polyp: Secondary | ICD-10-CM

## 2021-09-07 DIAGNOSIS — Z1211 Encounter for screening for malignant neoplasm of colon: Secondary | ICD-10-CM | POA: Diagnosis present

## 2021-09-07 DIAGNOSIS — D128 Benign neoplasm of rectum: Secondary | ICD-10-CM

## 2021-09-07 MED ORDER — SODIUM CHLORIDE 0.9 % IV SOLN
500.0000 mL | Freq: Once | INTRAVENOUS | Status: DC
Start: 2021-09-07 — End: 2021-09-07

## 2021-09-07 NOTE — Progress Notes (Signed)
? ?GASTROENTEROLOGY PROCEDURE H&P NOTE  ? ?Primary Care Physician: ?Binnie Rail, MD ? ?HPI: ?Destiny Douglas is a 51 y.o. female who presents for Colonoscopy for screening. ? ?Past Medical History:  ?Diagnosis Date  ? BREAST CYST, LEFT 08/28/2007  ? Complication of anesthesia   ? slow to awaken with 1 thryoid surgery  ? COVID 03/2020  ? mild symptoms x 2 weeks all symptoms reoslved  ? HYPOTHYROIDISM   ? post ablation for malignancy  ? OSTEOPENIA   ? Pt. denies any bone isssues.  Has had normal bone density tests  ? RHINITIS   ? THYROID CANCER 06/04/2002  ? s/p ablation  ? URI (upper respiratory infection) 12/26/2020  ? all symptoms reolved pe rpt  ? Wears glasses 01/31/2021  ? ?Past Surgical History:  ?Procedure Laterality Date  ? BREAST SURGERY    ? breast biopsy, right yrs ago per pt on 01-31-2021  ? DILATATION & CURETTAGE/HYSTEROSCOPY WITH MYOSURE N/A 02/03/2021  ? Procedure: [place];  Surgeon: Servando Salina, MD;  Location: [facility];  Service: Gynecology;  Laterality: N/A;  ? THYROIDECTOMY  06/04/2002  ? 1/2 2002 and the other half 2004  ? ?Current Outpatient Medications  ?Medication Sig Dispense Refill  ? Calcium 500-125 MG-UNIT TABS Take by mouth. Take two by mouth daily.    ? levothyroxine (SYNTHROID) 75 MCG tablet Take 1 tablet (75 mcg) one day a week, 88 mcg tablets the other 6 days of the week-NAME BRAND SYNTHROID    ? liothyronine (CYTOMEL) 5 MCG tablet Take 2 tablets by mouth daily.    ? Multiple Vitamin (MULTIVITAMIN) tablet Take 1 tablet by mouth.    ? ?Current Facility-Administered Medications  ?Medication Dose Route Frequency Provider Last Rate Last Admin  ? 0.9 %  sodium chloride infusion  500 mL Intravenous Once Mansouraty, Telford Nab., MD      ? ? ?Current Outpatient Medications:  ?  Calcium 500-125 MG-UNIT TABS, Take by mouth. Take two by mouth daily., Disp: , Rfl:  ?  levothyroxine (SYNTHROID) 75 MCG tablet, Take 1 tablet (75 mcg)  one day a week, 88 mcg tablets the other 6 days of the week-NAME BRAND SYNTHROID, Disp: , Rfl:  ?  liothyronine (CYTOMEL) 5 MCG tablet, Take 2 tablets by mouth daily., Disp: , Rfl:  ?  Multiple Vitamin (MULTIVITAMIN) tablet, Take 1 tablet by mouth., Disp: , Rfl:  ? ?Current Facility-Administered Medications:  ?  0.9 %  sodium chloride infusion, 500 mL, Intravenous, Once, Mansouraty, Telford Nab., MD ?Allergies  ?Allergen Reactions  ? Lactose Intolerance (Gi)   ? ?Family History  ?Problem Relation Age of Onset  ? Other Mother 36  ?     Endometriosis with adhesions on fallopian tubes  ? Breast cancer Mother   ? Coronary artery disease Father   ? Hyperlipidemia Father   ? Hypertension Father   ? Hypothyroidism Sister   ? Colon cancer Neg Hx   ? Colon polyps Neg Hx   ? Esophageal cancer Neg Hx   ? Rectal cancer Neg Hx   ? Stomach cancer Neg Hx   ? ?Social History  ? ?Socioeconomic History  ? Marital status: Married  ?  Spouse name: Not on file  ? Number of children: Not on file  ? Years of education: Not on file  ? Highest education level: Not on file  ?Occupational History  ? Not on file  ?Tobacco Use  ? Smoking status: Never  ?  Smokeless tobacco: Never  ? Tobacco comments:  ?  Married, lives with husband and their son. 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 Vaping Use: Never used  ?Substance and Sexual Activity  ? Alcohol use: Yes  ?  Comment: occasional glass of wine  ? Drug use: No  ? Sexual activity: Yes  ?  Birth control/protection: Condom  ?Other Topics Concern  ? Not on file  ?Social History Narrative  ? Running regularly for exercise  ? ?Social Determinants of Health  ? ?Financial Resource Strain: Not on file  ?Food Insecurity: Not on file  ?Transportation Needs: Not on file  ?Physical Activity: Not on file  ?Stress: Not on file  ?Social Connections: Not on file  ?Intimate Partner Violence: Not on file  ? ? ?Physical Exam: ?Today's Vitals  ?  09/07/21 1447  ?BP: 98/60  ?Pulse: 65  ?Temp: 97.8 ?F (36.6 ?C)  ?TempSrc: Temporal  ?SpO2: 100%  ?Weight: 105 lb (47.6 kg)  ?Height: '4\' 10"'$  (1.473 m)  ? ?Body mass index is 21.95 kg/m?. ?GEN: NAD ?EYE: Sclerae anicteric ?ENT: MMM ?CV: Non-tachycardic ?GI: Soft, NT/ND ?NEURO:  Alert & Oriented x 3 ? ?Lab Results: ?No results for input(s): WBC, HGB, HCT, PLT in the last 72 hours. ?BMET ?No results for input(s): NA, K, CL, CO2, GLUCOSE, BUN, CREATININE, CALCIUM in the last 72 hours. ?LFT ?No results for input(s): PROT, ALBUMIN, AST, ALT, ALKPHOS, BILITOT, BILIDIR, IBILI in the last 72 hours. ?PT/INR ?No results for input(s): LABPROT, INR in the last 72 hours. ? ? ?Impression / Plan: ?This is a 51 y.o.female who presents for Colonoscopy for screening. ? ?The risks and benefits of endoscopic evaluation/treatment were discussed with the patient and/or family; these include but are not limited to the risk of perforation, infection, bleeding, missed lesions, lack of diagnosis, severe illness requiring hospitalization, as well as anesthesia and sedation related illnesses.  The patient's history has been reviewed, patient examined, no change in status, and deemed stable for procedure.  The patient and/or family is agreeable to proceed.  ? ? ?Justice Britain, MD ?[facility] ?Advanced Endoscopy ?Office # [phone] ? ?

## 2021-09-07 NOTE — Progress Notes (Signed)
When the nurse tech went to take patient down to the car she was no longer in her wheelchair. The nurse tech went to look for the patient and the pt was seen entering her car safely. Instructed patient that next time she needed to be assisted to the car by staff for her safety.  ?

## 2021-09-07 NOTE — Progress Notes (Signed)
VS completed by DT.  Pt's states no medical or surgical changes since previsit or office visit.  

## 2021-09-07 NOTE — Progress Notes (Signed)
Called to room to assist during endoscopic procedure.  Patient ID and intended procedure confirmed with present staff. Received instructions for my participation in the procedure from the performing physician.  

## 2021-09-07 NOTE — Op Note (Signed)
[facility] ?Patient Name: Destiny Douglas ?Procedure Date: 09/07/2021 3:31 PM ?MRN: [id number] ?Endoscopist: Justice Britain , MD ?Age: 51 ?Referring MD:  ?Date of Birth: 14-May-1971 ?Gender: Female ?Account #: [id number] ?Procedure:                Colonoscopy ?Indications:              Screening for colorectal malignant neoplasm ?Medicines:                Monitored Anesthesia Care ?Procedure:                Pre-Anesthesia Assessment: ?                          - Prior to the procedure, a History and Physical  ?                          was performed, and patient medications and  ?                          allergies were reviewed. The patient's tolerance of  ?                          previous anesthesia was also reviewed. The risks  ?                          and benefits of the procedure and the sedation  ?                          options and risks were discussed with the patient.  ?                          All questions were answered, and informed consent  ?                          was obtained. Prior Anticoagulants: The patient has  ?                          taken no previous anticoagulant or antiplatelet  ?                          agents. ASA Grade Assessment: II - A patient with  ?                          mild systemic disease. After reviewing the risks  ?                          and benefits, the patient was deemed in  ?                          satisfactory condition to undergo the procedure. ?                          After obtaining informed consent, the colonoscope  ?  was passed under direct vision. Throughout the  ?                          procedure, the patient's blood pressure, pulse, and  ?                          oxygen saturations were monitored continuously. The  ?                          Olympus PCF-H190DL (#[id number]) Colonoscope was  ?                          introduced through the anus and advanced to the the  ?                          cecum,  identified by appendiceal orifice and  ?                          ileocecal valve. The colonoscopy was somewhat  ?                          difficult due to significant looping and a tortuous  ?                          colon. Successful completion of the procedure was  ?                          aided by changing the patient's position, using  ?                          manual pressure, straightening and shortening the  ?                          scope to obtain bowel loop reduction and using  ?                          scope torsion. The patient tolerated the procedure.  ?                          The quality of the bowel preparation was good. The  ?                          ileocecal valve, appendiceal orifice, and rectum  ?                          were photographed. ?Scope In: 3:49:54 PM ?Scope Out: 4:04:49 PM ?Scope Withdrawal Time: 0 hours 10 minutes 5 seconds  ?Total Procedure Duration: 0 hours 14 minutes 55 seconds  ?Findings:                 The digital rectal exam findings include  ?                          hemorrhoids. Pertinent negatives include no  ?  palpable rectal lesions. ?                          The colon (entire examined portion) was  ?                          significantly tortuous. ?                          One medium-sized submucosal lesion vs extrinsic  ?                          impression was found within the cecum base. ?                          Two sessile polyps were found in the rectum and  ?                          descending colon. The polyps were 3 to 4 mm in  ?                          size. These polyps were removed with a cold snare.  ?                          Resection and retrieval were complete. ?                          Normal mucosa was found in the entire colon  ?                          otherwise. ?                          Non-bleeding non-thrombosed external and internal  ?                          hemorrhoids were found during  retroflexion, during  ?                          perianal exam and during digital exam. The  ?                          hemorrhoids were Grade I (internal hemorrhoids that  ?                          do not prolapse). ?Complications:            No immediate complications. ?Estimated Blood Loss:     Estimated blood loss was minimal. ?Impression:               - Hemorrhoids found on digital rectal exam. ?                          - Tortuous colon. ?                          -  Submucosal lesion in the cecum. ?                          - Two 3 to 4 mm polyps in the rectum and in the  ?                          descending colon, removed with a cold snare.  ?                          Resected and retrieved. ?                          - Normal mucosa in the entire examined colon  ?                          otherwise. ?                          - Non-bleeding non-thrombosed external and internal  ?                          hemorrhoids. ?Recommendation:           - The patient will be observed post-procedure,  ?                          until all discharge criteria are met. ?                          - Discharge patient to home. ?                          - Patient has a contact number available for  ?                          emergencies. The signs and symptoms of potential  ?                          delayed complications were discussed with the  ?                          patient. Return to normal activities tomorrow.  ?                          Written discharge instructions were provided to the  ?                          patient. ?                          - High fiber diet. ?                          - Use FiberCon 1-2 tablets PO daily. ?                          - Continue present  medications. ?                          - Await pathology results. ?                          - Recommend non-urgent CTAP with IV/PO contrast.  ?                          This will help [place] define the subepithelial lesion v  ?                           extrinsic impression noted in the cecum base  ?                          (likely right ovary). ?                          - Repeat colonoscopy in 5-10 years for surveillance  ?                          based on pathology results and findings of  ?                          adenomatous tissue. ?                          - The findings and recommendations were discussed  ?                          with the patient. ?                          - The findings and recommendations were discussed  ?                          with the patient's family. ?Justice Britain, MD ?09/07/2021 4:13:21 PM ?

## 2021-09-07 NOTE — Patient Instructions (Signed)
Await pathology results. ? ?High fiber diet.  Use FiberCon 1-2 tablets daily. ? ?Continue present medications. ? ?Handout on polyps given. ? ?2 bottles of contrast media given to patient, with instructions that Dr. Donneta Romberg office will call her with scheduling details. ? ?YOU HAD AN ENDOSCOPIC PROCEDURE TODAY AT [facility]:   Refer to the procedure report that was given to you for any specific questions about what was found during the examination.  If the procedure report does not answer your questions, please call your gastroenterologist to clarify.  If you requested that your care partner not be given the details of your procedure findings, then the procedure report has been included in a sealed envelope for you to review at your convenience later. ? ?YOU SHOULD EXPECT: Some feelings of bloating in the abdomen. Passage of more gas than usual.  Walking can help get rid of the air that was put into your GI tract during the procedure and reduce the bloating. If you had a lower endoscopy (such as a colonoscopy or flexible sigmoidoscopy) you may notice spotting of blood in your stool or on the toilet paper. If you underwent a bowel prep for your procedure, you may not have a normal bowel movement for a few days. ? ?Please Note:  You might notice some irritation and congestion in your nose or some drainage.  This is from the oxygen used during your procedure.  There is no need for concern and it should clear up in a day or so. ? ?SYMPTOMS TO REPORT IMMEDIATELY: ? ?Following lower endoscopy (colonoscopy or flexible sigmoidoscopy): ? Excessive amounts of blood in the stool ? Significant tenderness or worsening of abdominal pains ? Swelling of the abdomen that is new, acute ? Fever of 100?F or higher ? ? For urgent or emergent issues, a gastroenterologist can be reached at any hour by calling [phone]. ?Do not use MyChart messaging for urgent concerns.  ? ? ?DIET:  We do recommend a small meal  at first, but then you may proceed to your regular diet.  Drink plenty of fluids but you should avoid alcoholic beverages for 24 hours. ? ?ACTIVITY:  You should plan to take it easy for the rest of today and you should NOT DRIVE or use heavy machinery until tomorrow (because of the sedation medicines used during the test).   ? ?FOLLOW UP: ?Our staff will call the number listed on your records 48-72 hours following your procedure to check on you and address any questions or concerns that you may have regarding the information given to you following your procedure. If we do not reach you, we will leave a message.  We will attempt to reach you two times.  During this call, we will ask if you have developed any symptoms of COVID 19. If you develop any symptoms (ie: fever, flu-like symptoms, shortness of breath, cough etc.) before then, please call [phone].  If you test positive for Covid 19 in the 2 weeks post procedure, please call and report this information to [place].   ? ?If any biopsies were taken you will be contacted by phone or by letter within the next 1-3 weeks.  Please call us at [phone] if you have not heard about the biopsies in 3 weeks.  ? ? ?SIGNATURES/CONFIDENTIALITY: ?You and/or your care partner have signed paperwork which will be entered into your electronic medical record.  These signatures attest to the fact that that the information above on your  After Visit Summary has been reviewed and is understood.  Full responsibility of the confidentiality of this discharge information lies with you and/or your care-partner.  ?

## 2021-09-07 NOTE — Progress Notes (Signed)
Report given to PACU, vss 

## 2021-09-07 NOTE — Progress Notes (Signed)
1546 Patient experiencing nausea and vomiting.  MD updated and Zofran 4 mg IV given, vss  ?

## 2021-09-11 ENCOUNTER — Telehealth: Payer: Self-pay

## 2021-09-11 ENCOUNTER — Other Ambulatory Visit: Payer: Self-pay

## 2021-09-11 DIAGNOSIS — K639 Disease of intestine, unspecified: Secondary | ICD-10-CM

## 2021-09-11 NOTE — Telephone Encounter (Signed)
CT AP ordered. Schedulers notified. Staff reminder sent to Patty, RN to ensure patient has set up CT scan.  ?

## 2021-09-11 NOTE — Telephone Encounter (Signed)
-----   Message from Irving Copas., MD sent at 09/07/2021  5:11 PM EDT ----- ?Regarding: Follow-up ?Destiny Douglas, ?This patient had a colonic subepithelial lesion versus extrinsic impression (more likely) noted at time of colonoscopy in the cecum. ?She needs a nonurgent CT abdomen/pelvis with IV and oral contrast in the coming weeks. ?I have discussed that with her and her husband at the time of her colonoscopy. ?Please schedule as able. ?Thanks. ?GM ? ?

## 2021-09-12 ENCOUNTER — Telehealth: Payer: Self-pay

## 2021-09-12 ENCOUNTER — Telehealth: Payer: Self-pay | Admitting: *Deleted

## 2021-09-12 NOTE — Telephone Encounter (Signed)
No answer, left message to call back later today, B.Anjannette Gauger RN. 

## 2021-09-12 NOTE — Telephone Encounter (Signed)
Attempted 2nd f/u phone call. No answer. Left message.  °

## 2021-09-15 ENCOUNTER — Encounter: Payer: Self-pay | Admitting: Gastroenterology

## 2021-09-19 ENCOUNTER — Telehealth: Payer: Self-pay

## 2021-09-19 ENCOUNTER — Ambulatory Visit (HOSPITAL_COMMUNITY): Payer: No Typology Code available for payment source

## 2021-09-19 DIAGNOSIS — K639 Disease of intestine, unspecified: Secondary | ICD-10-CM

## 2021-09-19 NOTE — Telephone Encounter (Signed)
Left message on machine to call back  

## 2021-09-19 NOTE — Telephone Encounter (Signed)
Thanks for update Patty. ?Good news. ?GM ?

## 2021-09-19 NOTE — Telephone Encounter (Signed)
Dr Rush Landmark I called the pt and she was ok with changing the location of the CT scan so that it will more cost effective.  I have called and [facility] will be cheaper.  The pt prefers to call them and set up the appt that works best for her schedule.   ?

## 2021-10-12 ENCOUNTER — Ambulatory Visit
Admission: RE | Admit: 2021-10-12 | Discharge: 2021-10-12 | Disposition: A | Discharge status: Home / Self Care | Payer: No Typology Code available for payment source | Source: Ambulatory Visit | Attending: Gastroenterology | Admitting: Gastroenterology

## 2021-10-12 DIAGNOSIS — K639 Disease of intestine, unspecified: Secondary | ICD-10-CM

## 2021-10-12 MED ORDER — IOPAMIDOL (ISOVUE-300) INJECTION 61%
100.0000 mL | Freq: Once | INTRAVENOUS | Status: AC | PRN
  Administered 2021-10-12: 100 mL via INTRAVENOUS

## 2021-10-27 ENCOUNTER — Telehealth: Payer: Self-pay | Admitting: Gastroenterology

## 2021-10-27 NOTE — Telephone Encounter (Signed)
Left message on machine to call back  

## 2021-10-27 NOTE — Telephone Encounter (Addendum)
Inbound call from patient requesting a call back to discuss colposcopy results and Ct results. Please advise.    [phone]

## 2021-10-31 NOTE — Telephone Encounter (Signed)
The pt has been advised of the CT scan results.  She would like to call back to make a follow up appt.

## 2022-05-24 ENCOUNTER — Ambulatory Visit: Payer: No Typology Code available for payment source | Admitting: Nurse Practitioner

## 2022-05-24 VITALS — BP 124/86 | HR 114 | Temp 99.2°F | Ht <= 58 in | Wt 110.4 lb

## 2022-05-24 DIAGNOSIS — J019 Acute sinusitis, unspecified: Secondary | ICD-10-CM

## 2022-05-24 LAB — POCT INFLUENZA A/B
Influenza A, POC: NEGATIVE
Influenza B, POC: NEGATIVE

## 2022-05-24 LAB — POCT RAPID STREP A (OFFICE): Rapid Strep A Screen: NEGATIVE

## 2022-05-24 LAB — POCT RESPIRATORY SYNCYTIAL VIRUS: RSV Rapid Ag: NEGATIVE

## 2022-05-24 MED ORDER — FLUTICASONE PROPIONATE 50 MCG/ACT NA SUSP: 2.0000 | Freq: Every day | NASAL | 6 refills | Status: DC

## 2022-05-24 MED ORDER — BENZONATATE 200 MG PO CAPS: 200.0000 mg | ORAL_CAPSULE | Freq: Two times a day (BID) | ORAL | 0 refills | Status: DC | PRN

## 2022-05-24 MED ORDER — AZITHROMYCIN 250 MG PO TABS: ORAL_TABLET | ORAL | 0 refills | Status: AC

## 2022-05-24 NOTE — Assessment & Plan Note (Signed)
Acute, point-of-care RSV, flu, strep test all negative.  No clinical signs of pneumonia.  Recommend symptom management with as needed Tylenol or ibuprofen, Tessalon Perles, Flonase nasal spray.  Will also send in course of azithromycin that patient can take if symptoms persist for an additional week.  Patient encouraged to call office if symptoms persist despite this treatment plan.  Patient reports understanding.

## 2022-05-24 NOTE — Progress Notes (Signed)
Established Patient Office Visit  Subjective   Patient ID: Destiny Douglas, female    DOB: 10/18/1970  Age: 51 y.o. MRN: [id number]  Chief Complaint  Patient presents with   Sinus Problem    Symptom onset 7 days ago. Had chills last night for the first time, did not check temperature. Also experiencing congestion, sinus pain, sore throat, nonproductive cough. Son had similar symptoms. She took an at-home test for covid yesterday which was negative. Reports has history of yearly sinus infection.     Review of Systems  Constitutional:  Positive for chills and malaise/fatigue.  HENT:  Positive for congestion, sinus pain and sore throat.   Respiratory:  Positive for cough. Negative for sputum production, shortness of breath and wheezing.   Cardiovascular:  Negative for chest pain.  Musculoskeletal:  Negative for myalgias.  Neurological:  Positive for headaches.      Objective:     BP 124/86   Pulse (!) 114   Temp 99.2 F (37.3 C) (Oral)   Ht '4\' 10"'$  (1.473 m)   Wt 110 lb 6 oz (50.1 kg)   LMP 09/10/2014   SpO2 98%   BMI 23.07 kg/m    Physical Exam Vitals reviewed.  Constitutional:      General: She is not in acute distress.    Appearance: Normal appearance.  HENT:     Head: Normocephalic and atraumatic.     Comments: Bilateral frontal and maxillary sinus tenderness    Right Ear: Hearing, tympanic membrane, ear canal and external ear normal.     Left Ear: Hearing, tympanic membrane, ear canal and external ear normal.     Mouth/Throat:     Lips: Pink.     Mouth: Mucous membranes are moist.     Tongue: No lesions.     Palate: No mass.     Pharynx: Oropharynx is clear. Posterior oropharyngeal erythema present. No pharyngeal swelling.  Neck:     Vascular: No carotid bruit.  Cardiovascular:     Rate and Rhythm: Regular rhythm. Tachycardia present.     Pulses: Normal pulses.     Heart sounds: Normal heart sounds.  Pulmonary:     Effort: Pulmonary effort is normal.      Breath sounds: Normal breath sounds.  Lymphadenopathy:     Cervical: No cervical adenopathy.  Skin:    General: Skin is warm and dry.  Neurological:     General: No focal deficit present.     Mental Status: She is alert and oriented to person, place, and time.  Psychiatric:        Mood and Affect: Mood normal.        Behavior: Behavior normal.        Judgment: Judgment normal.      No results found for any visits on 05/24/22.    The 10-year ASCVD risk score (Arnett DK, et al., 2019) is: 0.9%    Assessment & Plan:   Problem List Items Addressed This Visit       Respiratory   Sinusitis - Primary    Acute, point-of-care RSV, flu, strep test all negative.  No clinical signs of pneumonia.  Recommend symptom management with as needed Tylenol or ibuprofen, Tessalon Perles, Flonase nasal spray.  Will also send in course of azithromycin that patient can take if symptoms persist for an additional week.  Patient encouraged to call office if symptoms persist despite this treatment plan.  Patient reports understanding.      Relevant  Medications   azithromycin (ZITHROMAX) 250 MG tablet   fluticasone (FLONASE) 50 MCG/ACT nasal spray   benzonatate (TESSALON) 200 MG capsule   Other Relevant Orders   POCT Influenza A/B   POCT rapid strep A   POCT respiratory syncytial virus    Return if symptoms worsen or fail to improve.    Ailene Ards, NP

## 2022-12-14 ENCOUNTER — Telehealth: Payer: Self-pay | Admitting: Internal Medicine

## 2022-12-14 DIAGNOSIS — Z85850 Personal history of malignant neoplasm of thyroid: Secondary | ICD-10-CM

## 2022-12-14 DIAGNOSIS — Z0000 Encounter for general adult medical examination without abnormal findings: Secondary | ICD-10-CM

## 2022-12-14 NOTE — Telephone Encounter (Signed)
Patient has her physical scheduled for 01/09/2023. She would like to know if labs can be ordered so she can have those done prior to her appointment. Best callback is [phone].

## 2022-12-14 NOTE — Telephone Encounter (Signed)
Called pt no answer LMOM ok will place order  for standard cpx. Pt has Monia Pouch ok to do prior.Marland KitchenRaechel Chute

## 2023-01-03 ENCOUNTER — Other Ambulatory Visit: Payer: No Typology Code available for payment source

## 2023-01-03 DIAGNOSIS — Z13220 Encounter for screening for lipoid disorders: Secondary | ICD-10-CM

## 2023-01-03 DIAGNOSIS — Z85850 Personal history of malignant neoplasm of thyroid: Secondary | ICD-10-CM | POA: Diagnosis not present

## 2023-01-03 DIAGNOSIS — Z0000 Encounter for general adult medical examination without abnormal findings: Secondary | ICD-10-CM

## 2023-01-03 LAB — CBC WITH DIFFERENTIAL/PLATELET
Basophils Absolute: 0 10*3/uL (ref 0.0–0.1)
Basophils Relative: 0.5 % (ref 0.0–3.0)
Eosinophils Absolute: 0.1 10*3/uL (ref 0.0–0.7)
Eosinophils Relative: 2.6 % (ref 0.0–5.0)
HCT: 42.9 % (ref 36.0–46.0)
Hemoglobin: 14.7 g/dL (ref 12.0–15.0)
Lymphocytes Relative: 30.6 % (ref 12.0–46.0)
Lymphs Abs: 1.7 10*3/uL (ref 0.7–4.0)
MCHC: 34.2 g/dL (ref 30.0–36.0)
MCV: 86 fl (ref 78.0–100.0)
Monocytes Absolute: 0.4 10*3/uL (ref 0.1–1.0)
Monocytes Relative: 6.5 % (ref 3.0–12.0)
Neutro Abs: 3.4 10*3/uL (ref 1.4–7.7)
Neutrophils Relative %: 59.8 % (ref 43.0–77.0)
Platelets: 241 10*3/uL (ref 150.0–400.0)
RBC: 4.99 Mil/uL (ref 3.87–5.11)
RDW: 12.1 % (ref 11.5–15.5)
WBC: 5.7 10*3/uL (ref 4.0–10.5)

## 2023-01-03 LAB — URINALYSIS, ROUTINE W REFLEX MICROSCOPIC
Bilirubin Urine: NEGATIVE
Hgb urine dipstick: NEGATIVE
Ketones, ur: NEGATIVE
Leukocytes,Ua: NEGATIVE
Nitrite: NEGATIVE
RBC / HPF: NONE SEEN (ref 0–?)
Specific Gravity, Urine: 1.025 (ref 1.000–1.030)
Total Protein, Urine: NEGATIVE
Urine Glucose: NEGATIVE
Urobilinogen, UA: 0.2 (ref 0.0–1.0)
pH: 6 (ref 5.0–8.0)

## 2023-01-03 LAB — COMPREHENSIVE METABOLIC PANEL
ALT: 20 U/L (ref 0–35)
AST: 21 U/L (ref 0–37)
Albumin: 4.7 g/dL (ref 3.5–5.2)
Alkaline Phosphatase: 73 U/L (ref 39–117)
BUN: 12 mg/dL (ref 6–23)
CO2: 29 mEq/L (ref 19–32)
Calcium: 10.1 mg/dL (ref 8.4–10.5)
Chloride: 102 mEq/L (ref 96–112)
Creatinine, Ser: 0.87 mg/dL (ref 0.40–1.20)
GFR: 77.04 mL/min (ref >60.00)
Glucose, Bld: 85 mg/dL (ref 70–99)
Potassium: 3.7 mEq/L (ref 3.5–5.1)
Sodium: 139 mEq/L (ref 135–145)
Total Bilirubin: 0.6 mg/dL (ref 0.2–1.2)
Total Protein: 7.3 g/dL (ref 6.0–8.3)

## 2023-01-03 LAB — LIPID PANEL
Cholesterol: 186 mg/dL (ref 0–200)
HDL: 60 mg/dL (ref >39.00)
LDL Cholesterol: 107 mg/dL — ABNORMAL HIGH (ref 0–99)
NonHDL: 126.48
Total CHOL/HDL Ratio: 3
Triglycerides: 96 mg/dL (ref 0.0–149.0)
VLDL: 19.2 mg/dL (ref 0.0–40.0)

## 2023-01-03 LAB — TSH: TSH: <0.01 u[IU]/mL — ABNORMAL LOW (ref 0.35–5.50)

## 2023-01-08 NOTE — Patient Instructions (Addendum)
Medications changes include :   none    A referral was ordered and someone will call you to schedule an appointment.     Return in about 1 year (around 01/09/2024) for Physical Exam.    Health Maintenance, Female Adopting a healthy lifestyle and getting preventive care are important in promoting health and wellness. Ask your health care provider about: The right schedule for you to have regular tests and exams. Things you can do on your own to prevent diseases and keep yourself healthy. What should I know about diet, weight, and exercise? Eat a healthy diet  Eat a diet that includes plenty of vegetables, fruits, low-fat dairy products, and lean protein. Do not eat a lot of foods that are high in solid fats, added sugars, or sodium. Maintain a healthy weight Body mass index (BMI) is used to identify weight problems. It estimates body fat based on height and weight. Your health care provider can help determine your BMI and help you achieve or maintain a healthy weight. Get regular exercise Get regular exercise. This is one of the most important things you can do for your health. Most adults should: Exercise for at least 150 minutes each week. The exercise should increase your heart rate and make you sweat (moderate-intensity exercise). Do strengthening exercises at least twice a week. This is in addition to the moderate-intensity exercise. Spend less time sitting. Even light physical activity can be beneficial. Watch cholesterol and blood lipids Have your blood tested for lipids and cholesterol at 52 years of age, then have this test every 5 years. Have your cholesterol levels checked more often if: Your lipid or cholesterol levels are high. You are older than 52 years of age. You are at high risk for heart disease. What should I know about cancer screening? Depending on your health history and family history, you may need to have cancer screening at various ages. This may  include screening for: Breast cancer. Cervical cancer. Colorectal cancer. Skin cancer. Lung cancer. What should I know about heart disease, diabetes, and high blood pressure? Blood pressure and heart disease High blood pressure causes heart disease and increases the risk of stroke. This is more likely to develop in people who have high blood pressure readings or are overweight. Have your blood pressure checked: Every 3-5 years if you are 9-67 years of age. Every year if you are 19 years old or older. Diabetes Have regular diabetes screenings. This checks your fasting blood sugar level. Have the screening done: Once every three years after age 51 if you are at a normal weight and have a low risk for diabetes. More often and at a younger age if you are overweight or have a high risk for diabetes. What should I know about preventing infection? Hepatitis B If you have a higher risk for hepatitis B, you should be screened for this virus. Talk with your health care provider to find out if you are at risk for hepatitis B infection. Hepatitis C Testing is recommended for: Everyone born from [birth year] through 1965. Anyone with known risk factors for hepatitis C. Sexually transmitted infections (STIs) Get screened for STIs, including gonorrhea and chlamydia, if: You are sexually active and are younger than 52 years of age. You are older than 52 years of age and your health care provider tells you that you are at risk for this type of infection. Your sexual activity has changed since you were last screened, and you are  at increased risk for chlamydia or gonorrhea. Ask your health care provider if you are at risk. Ask your health care provider about whether you are at high risk for HIV. Your health care provider may recommend a prescription medicine to help prevent HIV infection. If you choose to take medicine to prevent HIV, you should first get tested for HIV. You should then be tested every 3 months  for as long as you are taking the medicine. Pregnancy If you are about to stop having your period (premenopausal) and you may become pregnant, seek counseling before you get pregnant. Take 400 to 800 micrograms (mcg) of folic acid every day if you become pregnant. Ask for birth control (contraception) if you want to prevent pregnancy. Osteoporosis and menopause Osteoporosis is a disease in which the bones lose minerals and strength with aging. This can result in bone fractures. If you are 23 years old or older, or if you are at risk for osteoporosis and fractures, ask your health care provider if you should: Be screened for bone loss. Take a calcium or vitamin D supplement to lower your risk of fractures. Be given hormone replacement therapy (HRT) to treat symptoms of menopause. Follow these instructions at home: Alcohol use Do not drink alcohol if: Your health care provider tells you not to drink. You are pregnant, may be pregnant, or are planning to become pregnant. If you drink alcohol: Limit how much you have to: 0-1 drink a day. Know how much alcohol is in your drink. In the [place] one drink equals one 12 oz bottle of beer (355 mL), one 5 oz glass of wine (148 mL), or one 1 oz glass of hard liquor (44 mL). Lifestyle Do not use any products that contain nicotine or tobacco. These products include cigarettes, chewing tobacco, and vaping devices, such as e-cigarettes. If you need help quitting, ask your health care provider. Do not use street drugs. Do not share needles. Ask your health care provider for help if you need support or information about quitting drugs. General instructions Schedule regular health, dental, and eye exams. Stay current with your vaccines. Tell your health care provider if: You often feel depressed. You have ever been abused or do not feel safe at home. Summary Adopting a healthy lifestyle and getting preventive care are important in promoting health and  wellness. Follow your health care provider's instructions about healthy diet, exercising, and getting tested or screened for diseases. Follow your health care provider's instructions on monitoring your cholesterol and blood pressure. This information is not intended to replace advice given to you by your health care provider. Make sure you discuss any questions you have with your health care provider. Document Revised: 10/10/2020 Document Reviewed: 10/10/2020 Elsevier Patient Education  2024 [employer].

## 2023-01-08 NOTE — Progress Notes (Unsigned)
Subjective:    Patient ID: Destiny Douglas, female    DOB: [birth year], 52 y.o.   MRN: [id number]      HPI Destiny Douglas is here for a Physical exam and her chronic medical problems.   Overall doing well.  No concerns.   Medications and allergies reviewed with patient and updated if appropriate.  Current Outpatient Medications on File Prior to Visit  Medication Sig Dispense Refill   Calcium 500-125 MG-UNIT TABS Take by mouth. Take two by mouth daily.     levothyroxine (SYNTHROID) 75 MCG tablet Take 1 tablet (75 mcg) one day a week, 88 mcg tablets the other 6 days of the week-NAME BRAND SYNTHROID     levothyroxine (SYNTHROID) 88 MCG tablet Take 88 mcg by mouth once a week. Once a week     liothyronine (CYTOMEL) 5 MCG tablet Take 2 tablets by mouth daily.     Multiple Vitamin (MULTIVITAMIN) tablet Take 1 tablet by mouth.     No current facility-administered medications on file prior to visit.    Review of Systems  Constitutional:  Negative for fever.       Hot flashes associated with menopause  Eyes:  Negative for visual disturbance.  Respiratory:  Positive for shortness of breath (with strenuous exertion, occ with stairs). Negative for cough and wheezing.   Cardiovascular:  Positive for chest pain. Negative for palpitations and leg swelling.  Gastrointestinal:  Negative for abdominal pain, blood in stool, constipation and diarrhea.       No gerd  Genitourinary:  Negative for dysuria and frequency.       Nocturia x 1  Musculoskeletal:  Negative for arthralgias and back pain.  Skin:  Negative for rash.  Neurological:  Negative for light-headedness and headaches.  Psychiatric/Behavioral:  Positive for sleep disturbance (Interrupted at times-related to menopause, hot flashes). Negative for dysphoric mood. The patient is not nervous/anxious.        Objective:   Vitals:   01/09/23 1320  BP: 118/78  Pulse: 70  Temp: 98.2 F (36.8 C)  SpO2: 97%   Filed Weights   01/09/23  1320  Weight: 110 lb (49.9 kg)   Body mass index is 22.99 kg/m.  BP Readings from Last 3 Encounters:  01/09/23 118/78  05/24/22 124/86  09/07/21 111/64    Wt Readings from Last 3 Encounters:  01/09/23 110 lb (49.9 kg)  05/24/22 110 lb 6 oz (50.1 kg)  09/07/21 105 lb (47.6 kg)       Physical Exam Constitutional: She appears well-developed and well-nourished. No distress.  HENT:  Head: Normocephalic and atraumatic.  Right Ear: External ear normal. Normal ear canal and TM Left Ear: External ear normal.  Normal ear canal and TM Mouth/Throat: Oropharynx is clear and moist.  Eyes: Conjunctivae normal.  Neck: Neck supple. No tracheal deviation present. No thyromegaly present.  No carotid bruit  Cardiovascular: Normal rate, regular rhythm and normal heart sounds.   No murmur heard.  No edema. Pulmonary/Chest: Effort normal and breath sounds normal. No respiratory distress. She has no wheezes. She has no rales.  Breast: deferred   Abdominal: Soft. She exhibits no distension. There is no tenderness.  Lymphadenopathy: She has no cervical adenopathy.  Skin: Skin is warm and dry. She is not diaphoretic.  Psychiatric: She has a normal mood and affect. Her behavior is normal.     Lab Results  Component Value Date   WBC 5.7 01/03/2023   HGB 14.7 01/03/2023   HCT  42.9 01/03/2023   PLT 241.0 01/03/2023   GLUCOSE 85 01/03/2023   CHOL 186 01/03/2023   TRIG 96.0 01/03/2023   HDL 60.00 01/03/2023   LDLCALC 107 (H) 01/03/2023   ALT 20 01/03/2023   AST 21 01/03/2023   NA 139 01/03/2023   K 3.7 01/03/2023   CL 102 01/03/2023   CREATININE 0.87 01/03/2023   BUN 12 01/03/2023   CO2 29 01/03/2023   TSH <0.01 Repeated and verified X2. (L) 01/03/2023   HGBA1C 5.2 06/14/2014         Assessment & Plan:   Physical exam: Screening blood work  ordered / reviewed Exercise  walk, run, weights Weight  normal Substance abuse  none   Reviewed recommended immunizations.   Health  Maintenance  Topic Date Due   Hepatitis C Screening  Never done   Zoster Vaccines- Shingrix (1 of 2) Never done   MAMMOGRAM  12/28/2021   COVID-19 Vaccine (5 - 2023-24 season) 02/02/2022   PAP SMEAR-Modifier  02/14/2022   INFLUENZA VACCINE  01/03/2023   DTaP/Tdap/Td (5 - Td or Tdap) 07/07/2031   Colonoscopy  09/08/2031   HIV Screening  Completed   HPV VACCINES  Aged Out          See Problem List for Assessment and Plan of chronic medical problems.

## 2023-01-09 ENCOUNTER — Ambulatory Visit: Payer: No Typology Code available for payment source | Admitting: Internal Medicine

## 2023-01-09 ENCOUNTER — Encounter: Payer: Self-pay | Admitting: Internal Medicine

## 2023-01-09 VITALS — BP 118/78 | HR 70 | Temp 98.2°F | Ht <= 58 in | Wt 110.0 lb

## 2023-01-09 DIAGNOSIS — Z0000 Encounter for general adult medical examination without abnormal findings: Secondary | ICD-10-CM | POA: Diagnosis not present

## 2023-01-09 DIAGNOSIS — Z85850 Personal history of malignant neoplasm of thyroid: Secondary | ICD-10-CM

## 2023-01-09 DIAGNOSIS — E890 Postprocedural hypothyroidism: Secondary | ICD-10-CM

## 2023-01-09 DIAGNOSIS — Z8249 Family history of ischemic heart disease and other diseases of the circulatory system: Secondary | ICD-10-CM | POA: Diagnosis not present

## 2023-01-09 NOTE — Assessment & Plan Note (Signed)
Discussed possibly getting a coronary artery calcium score-she will think about that and let me know Continue regular exercise, healthy diet Cholesterol is good

## 2023-01-09 NOTE — Assessment & Plan Note (Signed)
Following with endocrine

## 2023-01-09 NOTE — Assessment & Plan Note (Signed)
Chronic Management per endocrine 

## 2023-11-13 ENCOUNTER — Encounter: Payer: Self-pay | Admitting: Internal Medicine

## 2023-11-13 NOTE — Progress Notes (Signed)
 Subjective:    Patient ID: Destiny Douglas, female    DOB: 1970-08-02, 53 y.o.   MRN: [id number]      HPI Destiny Douglas is here for  Chief Complaint  Patient presents with   Sore Throat    Patient's husband had raging strep infection and she wants to make sure she isn't coming down strep; Throat was sore last night but not this morning. Eyes had lots of discharge; Would like to have preventative medication incase she has Strep.    A few weeks ago had sinus infection and took an abx.  Yesterday husband tested positive for strep  She is here for an acute visit for cold symptoms.   Her symptoms started a few days  She is experiencing tickle in throat - sore throat that is minimal, goop in her eyes, mild morning nasal congestion, pnd. Denies other symptoms.   Son graduates this weekend and is concerned about having strep.  She has not taken anything for her symptoms.      Medications and allergies reviewed with patient and updated if appropriate.  Current Outpatient Medications on File Prior to Visit  Medication Sig Dispense Refill   Calcium 500-125 MG-UNIT TABS Take by mouth. Take two by mouth daily.     levothyroxine (SYNTHROID) 75 MCG tablet Take 1 tablet (75 mcg) one day a week, 88 mcg tablets the other 6 days of the week-NAME BRAND SYNTHROID     levothyroxine (SYNTHROID) 88 MCG tablet Take 88 mcg by mouth once a week. Once a week     liothyronine (CYTOMEL) 5 MCG tablet Take 2 tablets by mouth daily.     Multiple Vitamin (MULTIVITAMIN) tablet Take 1 tablet by mouth.     Multiple Vitamins-Minerals (ONE DAILY CALCIUM/IRON) TABS Take 1 tablet by mouth daily.     No current facility-administered medications on file prior to visit.    Review of Systems  Constitutional:  Negative for chills and fever.  HENT:  Positive for congestion (in am - mild), ear pain (right ear discmfort - mild), postnasal drip and sore throat (minimal). Negative for sinus pressure.   Eyes:  Positive for  discharge.  Respiratory:  Negative for cough, shortness of breath and wheezing.   Gastrointestinal:  Negative for diarrhea and nausea.  Musculoskeletal:  Negative for myalgias.  Neurological:  Negative for dizziness, light-headedness and headaches.       Objective:   Vitals:   11/14/23 0907  BP: 116/74  Pulse: 68  Temp: 98.2 F (36.8 C)  SpO2: 98%   BP Readings from Last 3 Encounters:  11/14/23 116/74  01/09/23 118/78  05/24/22 124/86   Wt Readings from Last 3 Encounters:  11/14/23 114 lb (51.7 kg)  01/09/23 110 lb (49.9 kg)  05/24/22 110 lb 6 oz (50.1 kg)   Body mass index is 23.83 kg/m.    Physical Exam Constitutional:      General: She is not in acute distress.    Appearance: Normal appearance. She is not ill-appearing.  HENT:     Head: Normocephalic and atraumatic.     Right Ear: Tympanic membrane, ear canal and external ear normal.     Left Ear: Tympanic membrane, ear canal and external ear normal.     Mouth/Throat:     Mouth: Mucous membranes are moist.     Pharynx: No oropharyngeal exudate or posterior oropharyngeal erythema.   Eyes:     Conjunctiva/sclera: Conjunctivae normal.    Cardiovascular:  Rate and Rhythm: Normal rate and regular rhythm.  Pulmonary:     Effort: Pulmonary effort is normal. No respiratory distress.     Breath sounds: Normal breath sounds. No wheezing or rales.   Musculoskeletal:     Cervical back: Neck supple. No tenderness.  Lymphadenopathy:     Cervical: No cervical adenopathy.   Skin:    General: Skin is warm and dry.   Neurological:     Mental Status: She is alert.            Assessment & Plan:    See Problem List for Assessment and Plan of chronic medical problems.

## 2023-11-13 NOTE — Patient Instructions (Addendum)
    Your strept test is negative    Medications changes include :   None      Return if symptoms worsen or fail to improve.

## 2023-11-14 ENCOUNTER — Other Ambulatory Visit: Payer: Self-pay

## 2023-11-14 ENCOUNTER — Ambulatory Visit: Admitting: Internal Medicine

## 2023-11-14 VITALS — BP 116/74 | HR 68 | Temp 98.2°F | Ht <= 58 in | Wt 114.0 lb

## 2023-11-14 DIAGNOSIS — J069 Acute upper respiratory infection, unspecified: Secondary | ICD-10-CM | POA: Insufficient documentation

## 2023-11-14 DIAGNOSIS — J029 Acute pharyngitis, unspecified: Secondary | ICD-10-CM | POA: Diagnosis not present

## 2023-11-14 LAB — POCT RAPID STREP A (OFFICE): Rapid Strep A Screen: NEGATIVE

## 2023-11-14 NOTE — Addendum Note (Signed)
 Addended by: Katherene Pals on: 11/14/2023 09:55 AM   Modules accepted: Orders

## 2023-11-14 NOTE — Assessment & Plan Note (Signed)
 Acute Symptoms likely viral in nature Rapid strep is negative Continue symptomatic treatment with over-the-counter cold medications, Tylenol /ibuprofen Increase rest and fluids Call if symptoms worsen or do not improve

## 2024-01-01 ENCOUNTER — Ambulatory Visit: Payer: Self-pay

## 2024-01-01 NOTE — Telephone Encounter (Signed)
 FYI Only or Action Required?: FYI only for provider.  Patient was last seen in primary care on 11/14/2023 by Geofm Glade PARAS, MD.  Called Nurse Triage reporting Abdominal Pain.  Symptoms began today.  Interventions attempted: Nothing.  Symptoms are: gradually worsening.  Triage Disposition: See Physician Within 24 Hours  Patient/caregiver understands and will follow disposition?: Yes    Copied from CRM [id number]. Topic: Clinical - Red Word Triage >> Jan 01, 2024  9:40 AM Thersia BROCKS wrote: [facility] that prompted transfer to Nurse Triage: Patient called in stated she is having very intense stomach pain, has been vomiting believes she has food poison Reason for Disposition  Yellowish color of the skin or white of the eye (i.e., jaundice)    Throwing bile  [1] MODERATE pain (e.g., interferes with normal activities) AND [2] pain comes and goes (cramps) AND [3] present > 24 hours  (Exception: Pain with Vomiting or Diarrhea - see that Guideline.)  Answer Assessment - Initial Assessment Questions 1. LOCATION: Where does it hurt?  Center/left abd pain 7/10: pain is spread in different location 2. RADIATION: Does the pain shoot anywhere else? (e.g., chest, back)     no 3. ONSET: When did the pain begin? (e.g., minutes, hours or days ago)      today 4. SUDDEN: Gradual or sudden onset?     sudden 5. PATTERN Does the pain come and go, or is it constant?     constant 6. SEVERITY: How bad is the pain?  (e.g., Scale 1-10; mild, moderate, or severe)     7/10 7. RECURRENT SYMPTOM: Have you ever had this type of stomach pain before? If Yes, ask: When was the last time? and What happened that time?      no 8. CAUSE: What do you think is causing the stomach pain? (e.g., gallstones, recent abdominal surgery)     unknown 9. RELIEVING/AGGRAVATING FACTORS: What makes it better or worse? (e.g., antacids, bending or twisting motion, bowel movement)     na 10. OTHER SYMPTOMS: Do  you have any other symptoms? (e.g., back pain, diarrhea, fever, urination pain, vomiting)       Diarrhea- watery and loose 11. PREGNANCY: Is there any chance you are pregnant? When was your last menstrual period?       No  Referred patient urgent care and scheduled urgent care appt for patient.  Answer Assessment - Initial Assessment Questions 1. NAUSEA SEVERITY: How bad is the nausea? (e.g., mild, moderate, severe; dehydration, weight loss)     moderate 2. ONSET: When did the nausea begin?     today 3. VOMITING: Any vomiting? If Yes, ask: How many times today?     Vomiting bile 4. RECURRENT SYMPTOM: Have you had nausea before? If Yes, ask: When was the last time? What happened that time?     na 5. CAUSE: What do you think is causing the nausea?     unknown 6. PREGNANCY: Is there any chance you are pregnant? (e.g., unprotected intercourse, missed birth control pill, broken condom)     na  Protocols used: Abdominal Pain - Female-A-AH, Nausea-A-AH

## 2024-01-01 NOTE — Telephone Encounter (Signed)
 Agree with evaluation today a UC

## 2024-02-12 ENCOUNTER — Encounter: Payer: Self-pay | Admitting: Internal Medicine

## 2024-02-12 NOTE — Progress Notes (Unsigned)
 Subjective:    Patient ID: Destiny Douglas, female    DOB: 31-Dec-1970, 53 y.o.   MRN: [id number]      HPI Lajuana is here for a Physical exam and her chronic medical problems.   No concerns.   Medications and allergies reviewed with patient and updated if appropriate.  Current Outpatient Medications on File Prior to Visit  Medication Sig Dispense Refill   Calcium 500-125 MG-UNIT TABS Take by mouth. Take two by mouth daily.     levothyroxine (SYNTHROID) 75 MCG tablet Take 1 tablet (75 mcg) one day a week, 88 mcg tablets the other 6 days of the week-NAME BRAND SYNTHROID     levothyroxine (SYNTHROID) 88 MCG tablet Take 88 mcg by mouth once a week. Once a week     liothyronine (CYTOMEL) 5 MCG tablet Take 2 tablets by mouth daily.     Multiple Vitamin (MULTIVITAMIN) tablet Take 1 tablet by mouth.     Multiple Vitamins-Minerals (ONE DAILY CALCIUM/IRON) TABS Take 1 tablet by mouth daily.     No current facility-administered medications on file prior to visit.    Review of Systems  Constitutional:  Negative for fever.  Eyes:  Negative for visual disturbance.  Respiratory:  Negative for cough, shortness of breath and wheezing.   Cardiovascular:  Negative for chest pain, palpitations and leg swelling.  Gastrointestinal:  Negative for abdominal pain, blood in stool, constipation and diarrhea.       No gerd  Genitourinary:  Negative for dysuria.  Musculoskeletal:  Positive for back pain (mild, occ) and neck pain (mild, occ). Negative for arthralgias.  Skin:  Negative for rash.  Neurological:  Negative for dizziness, light-headedness, numbness and headaches.  Psychiatric/Behavioral:  Positive for sleep disturbance (hotflashes). Negative for dysphoric mood. The patient is not nervous/anxious.        Objective:   Vitals:   02/13/24 0856  BP: 110/74  Pulse: 79  Temp: 98 F (36.7 C)  SpO2: 100%   Filed Weights   02/13/24 0856  Weight: 111 lb (50.3 kg)   Body mass index is  23.2 kg/m.  BP Readings from Last 3 Encounters:  02/13/24 110/74  11/14/23 116/74  01/09/23 118/78    Wt Readings from Last 3 Encounters:  02/13/24 111 lb (50.3 kg)  11/14/23 114 lb (51.7 kg)  01/09/23 110 lb (49.9 kg)       Physical Exam Constitutional: She appears well-developed and well-nourished. No distress.  HENT:  Head: Normocephalic and atraumatic.  Right Ear: External ear normal. Normal ear canal and TM Left Ear: External ear normal.  Normal ear canal and TM Mouth/Throat: Oropharynx is clear and moist.  Eyes: Conjunctivae normal.  Neck: Neck supple. No tracheal deviation present. No thyromegaly present.  No carotid bruit  Cardiovascular: Normal rate, regular rhythm and normal heart sounds.   No murmur heard.  No edema. Pulmonary/Chest: Effort normal and breath sounds normal. No respiratory distress. She has no wheezes. She has no rales.  Breast: deferred   Abdominal: Soft. She exhibits no distension. There is no tenderness.  Lymphadenopathy: She has no cervical adenopathy.  Skin: Skin is warm and dry. She is not diaphoretic.  Psychiatric: She has a normal mood and affect. Her behavior is normal.     Lab Results  Component Value Date   WBC 5.7 01/03/2023   HGB 14.7 01/03/2023   HCT 42.9 01/03/2023   PLT 241.0 01/03/2023   GLUCOSE 85 01/03/2023   CHOL 186 01/03/2023  TRIG 96.0 01/03/2023   HDL 60.00 01/03/2023   LDLCALC 107 (H) 01/03/2023   ALT 20 01/03/2023   AST 21 01/03/2023   NA 139 01/03/2023   K 3.7 01/03/2023   CL 102 01/03/2023   CREATININE 0.87 01/03/2023   BUN 12 01/03/2023   CO2 29 01/03/2023   TSH <0.01 Repeated and verified X2. (L) 01/03/2023   HGBA1C 5.2 06/14/2014         Assessment & Plan:   Physical exam: Screening blood work  ordered Exercise  regular - walks 1 mile daily, doing some weights or walking a night Weight  normal Substance abuse  none   Reviewed recommended immunizations.   Flu immunization administered  today.   Up to date with Dr Rutherford   Health Maintenance  Topic Date Due   Hepatitis C Screening  Never done   Hepatitis B Vaccines 19-59 Average Risk (1 of 3 - 19+ 3-dose series) Never done   Zoster Vaccines- Shingrix (1 of 2) Never done   Cervical Cancer Screening (HPV/Pap Cotest)  02/15/2020   Pneumococcal Vaccine: 50+ Years (1 of 1 - PCV) Never done   Mammogram  12/28/2021   Influenza Vaccine  01/03/2024   COVID-19 Vaccine (5 - 2025-26 season) 02/03/2024   DTaP/Tdap/Td (5 - Td or Tdap) 07/07/2031   Colonoscopy  09/08/2031   HIV Screening  Completed   HPV VACCINES  Aged Out   Meningococcal B Vaccine  Aged Out          See Problem List for Assessment and Plan of chronic medical problems.

## 2024-02-12 NOTE — Patient Instructions (Addendum)
 Flu immunization administered today.     Blood work was ordered.       Medications changes include :   None     Return in about 1 year (around 02/12/2025) for Physical Exam.    Health Maintenance, Female Adopting a healthy lifestyle and getting preventive care are important in promoting health and wellness. Ask your health care provider about: The right schedule for you to have regular tests and exams. Things you can do on your own to prevent diseases and keep yourself healthy. What should I know about diet, weight, and exercise? Eat a healthy diet  Eat a diet that includes plenty of vegetables, fruits, low-fat dairy products, and lean protein. Do not eat a lot of foods that are high in solid fats, added sugars, or sodium. Maintain a healthy weight Body mass index (BMI) is used to identify weight problems. It estimates body fat based on height and weight. Your health care provider can help determine your BMI and help you achieve or maintain a healthy weight. Get regular exercise Get regular exercise. This is one of the most important things you can do for your health. Most adults should: Exercise for at least 150 minutes each week. The exercise should increase your heart rate and make you sweat (moderate-intensity exercise). Do strengthening exercises at least twice a week. This is in addition to the moderate-intensity exercise. Spend less time sitting. Even light physical activity can be beneficial. Watch cholesterol and blood lipids Have your blood tested for lipids and cholesterol at 53 years of age, then have this test every 5 years. Have your cholesterol levels checked more often if: Your lipid or cholesterol levels are high. You are older than 52 years of age. You are at high risk for heart disease. What should I know about cancer screening? Depending on your health history and family history, you may need to have cancer screening at various ages. This may include  screening for: Breast cancer. Cervical cancer. Colorectal cancer. Skin cancer. Lung cancer. What should I know about heart disease, diabetes, and high blood pressure? Blood pressure and heart disease High blood pressure causes heart disease and increases the risk of stroke. This is more likely to develop in people who have high blood pressure readings or are overweight. Have your blood pressure checked: Every 3-5 years if you are 44-33 years of age. Every year if you are 64 years old or older. Diabetes Have regular diabetes screenings. This checks your fasting blood sugar level. Have the screening done: Once every three years after age 68 if you are at a normal weight and have a low risk for diabetes. More often and at a younger age if you are overweight or have a high risk for diabetes. What should I know about preventing infection? Hepatitis B If you have a higher risk for hepatitis B, you should be screened for this virus. Talk with your health care provider to find out if you are at risk for hepatitis B infection. Hepatitis C Testing is recommended for: Everyone born from [birth year] through 1965. Anyone with known risk factors for hepatitis C. Sexually transmitted infections (STIs) Get screened for STIs, including gonorrhea and chlamydia, if: You are sexually active and are younger than 53 years of age. You are older than 53 years of age and your health care provider tells you that you are at risk for this type of infection. Your sexual activity has changed since you were last screened, and you are  at increased risk for chlamydia or gonorrhea. Ask your health care provider if you are at risk. Ask your health care provider about whether you are at high risk for HIV. Your health care provider may recommend a prescription medicine to help prevent HIV infection. If you choose to take medicine to prevent HIV, you should first get tested for HIV. You should then be tested every 3 months for as  long as you are taking the medicine. Pregnancy If you are about to stop having your period (premenopausal) and you may become pregnant, seek counseling before you get pregnant. Take 400 to 800 micrograms (mcg) of folic acid every day if you become pregnant. Ask for birth control (contraception) if you want to prevent pregnancy. Osteoporosis and menopause Osteoporosis is a disease in which the bones lose minerals and strength with aging. This can result in bone fractures. If you are 59 years old or older, or if you are at risk for osteoporosis and fractures, ask your health care provider if you should: Be screened for bone loss. Take a calcium or vitamin D  supplement to lower your risk of fractures. Be given hormone replacement therapy (HRT) to treat symptoms of menopause. Follow these instructions at home: Alcohol use Do not drink alcohol if: Your health care provider tells you not to drink. You are pregnant, may be pregnant, or are planning to become pregnant. If you drink alcohol: Limit how much you have to: 0-1 drink a day. Know how much alcohol is in your drink. In the [place] one drink equals one 12 oz bottle of beer (355 mL), one 5 oz glass of wine (148 mL), or one 1 oz glass of hard liquor (44 mL). Lifestyle Do not use any products that contain nicotine or tobacco. These products include cigarettes, chewing tobacco, and vaping devices, such as e-cigarettes. If you need help quitting, ask your health care provider. Do not use street drugs. Do not share needles. Ask your health care provider for help if you need support or information about quitting drugs. General instructions Schedule regular health, dental, and eye exams. Stay current with your vaccines. Tell your health care provider if: You often feel depressed. You have ever been abused or do not feel safe at home. Summary Adopting a healthy lifestyle and getting preventive care are important in promoting health and  wellness. Follow your health care provider's instructions about healthy diet, exercising, and getting tested or screened for diseases. Follow your health care provider's instructions on monitoring your cholesterol and blood pressure. This information is not intended to replace advice given to you by your health care provider. Make sure you discuss any questions you have with your health care provider. Document Revised: 10/10/2020 Document Reviewed: 10/10/2020 Elsevier Patient Education  2024 [employer].

## 2024-02-13 ENCOUNTER — Ambulatory Visit: Payer: Self-pay | Admitting: Internal Medicine

## 2024-02-13 ENCOUNTER — Ambulatory Visit (INDEPENDENT_AMBULATORY_CARE_PROVIDER_SITE_OTHER): Admitting: Internal Medicine

## 2024-02-13 VITALS — BP 110/74 | HR 79 | Temp 98.0°F | Ht <= 58 in | Wt 111.0 lb

## 2024-02-13 DIAGNOSIS — E890 Postprocedural hypothyroidism: Secondary | ICD-10-CM | POA: Diagnosis not present

## 2024-02-13 DIAGNOSIS — Z23 Encounter for immunization: Secondary | ICD-10-CM | POA: Diagnosis not present

## 2024-02-13 DIAGNOSIS — Z0000 Encounter for general adult medical examination without abnormal findings: Secondary | ICD-10-CM | POA: Diagnosis not present

## 2024-02-13 DIAGNOSIS — Z1159 Encounter for screening for other viral diseases: Secondary | ICD-10-CM

## 2024-02-13 LAB — CBC WITH DIFFERENTIAL/PLATELET
Basophils Absolute: 0 K/uL (ref 0.0–0.1)
Basophils Relative: 0.7 % (ref 0.0–3.0)
Eosinophils Absolute: 0.1 K/uL (ref 0.0–0.7)
Eosinophils Relative: 2.5 % (ref 0.0–5.0)
HCT: 42.6 % (ref 36.0–46.0)
Hemoglobin: 14.5 g/dL (ref 12.0–15.0)
Lymphocytes Relative: 31.7 % (ref 12.0–46.0)
Lymphs Abs: 1.9 K/uL (ref 0.7–4.0)
MCHC: 34.1 g/dL (ref 30.0–36.0)
MCV: 86 fl (ref 78.0–100.0)
Monocytes Absolute: 0.4 K/uL (ref 0.1–1.0)
Monocytes Relative: 7.1 % (ref 3.0–12.0)
Neutro Abs: 3.5 K/uL (ref 1.4–7.7)
Neutrophils Relative %: 58 % (ref 43.0–77.0)
Platelets: 244 K/uL (ref 150.0–400.0)
RBC: 4.95 Mil/uL (ref 3.87–5.11)
RDW: 12.2 % (ref 11.5–15.5)
WBC: 6 K/uL (ref 4.0–10.5)

## 2024-02-13 LAB — LIPID PANEL
Cholesterol: 228 mg/dL — ABNORMAL HIGH (ref 0–200)
HDL: 54.4 mg/dL (ref >39.00)
LDL Cholesterol: 151 mg/dL — ABNORMAL HIGH (ref 0–99)
NonHDL: 173.29
Total CHOL/HDL Ratio: 4
Triglycerides: 111 mg/dL (ref 0.0–149.0)
VLDL: 22.2 mg/dL (ref 0.0–40.0)

## 2024-02-13 LAB — COMPREHENSIVE METABOLIC PANEL WITH GFR
ALT: 16 U/L (ref 0–35)
AST: 19 U/L (ref 0–37)
Albumin: 4.6 g/dL (ref 3.5–5.2)
Alkaline Phosphatase: 56 U/L (ref 39–117)
BUN: 16 mg/dL (ref 6–23)
CO2: 28 meq/L (ref 19–32)
Calcium: 9.7 mg/dL (ref 8.4–10.5)
Chloride: 102 meq/L (ref 96–112)
Creatinine, Ser: 0.82 mg/dL (ref 0.40–1.20)
GFR: 82.07 mL/min (ref >60.00)
Glucose, Bld: 83 mg/dL (ref 70–99)
Potassium: 4.2 meq/L (ref 3.5–5.1)
Sodium: 139 meq/L (ref 135–145)
Total Bilirubin: 0.5 mg/dL (ref 0.2–1.2)
Total Protein: 7 g/dL (ref 6.0–8.3)

## 2024-02-13 NOTE — Assessment & Plan Note (Addendum)
 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

## 2024-02-14 LAB — HEPATITIS C ANTIBODY: Hepatitis C Ab: NONREACTIVE

## 2024-02-24 ENCOUNTER — Ambulatory Visit: Payer: Self-pay

## 2024-02-24 ENCOUNTER — Encounter: Payer: Self-pay | Admitting: Internal Medicine

## 2024-02-24 NOTE — Telephone Encounter (Signed)
 FYI Only or Action Required?: FYI only for provider.  Patient was last seen in primary care on 02/13/2024 by Geofm Glade PARAS, MD.  Called Nurse Triage reporting Insect Bite.  Symptoms began yesterday.  Interventions attempted: Nothing.  Symptoms are: stable.  Triage Disposition: See Physician Within 24 Hours  Patient/caregiver understands and will follow disposition?: Yes Reason for Disposition  [1] Red or very tender (to touch) area AND [2] started over 24 hours after the bite  Answer Assessment - Initial Assessment Questions States she tends to have allergic reactions easily to bug bites, states this has happened before and she swells up like crazy.  1. TYPE of INSECT: What type of insect was it?      Unsure, was walking dog and noticed a sharp stinging pain  2. ONSET: When did you get bitten?      Yesterday  3. LOCATION: Where is the insect bite located?      Right thigh   4. REDNESS: Is the area red or pink? If Yes, ask: What size is the area of redness? (inches or cm). When did the redness start?     Yes, redness is spreading, started as a nickel size, now its the size of a grapefruit   5. PAIN: Is there any pain? If Yes, ask: How bad is the pain? (Scale 0-10; or none, mild, moderate, severe)     No  6. ITCHING: Does it itch? If Yes, ask: How bad is the itch?      Getting very itchy  7. SWELLING: How big is the swelling? (e.g., inches, cm, or compare to coins)     Yes   8. OTHER SYMPTOMS: Do you have any other symptoms?  (e.g., difficulty breathing, fever, hives)     No  Protocols used: Insect Bite-A-AH  Copied from CRM [id number]. Topic: Clinical - Red Word Triage >> Feb 24, 2024  1:44 PM Rea ORN wrote: Red Word that prompted transfer to Nurse Triage: Pt called to advise that she got stung yesterday on her right thigh and now it is swollen

## 2024-02-24 NOTE — Progress Notes (Unsigned)
    Subjective:    Patient ID: Destiny Douglas, female    DOB: 1970/07/05, 53 y.o.   MRN: [id number]      HPI Destiny Douglas is here for No chief complaint on file.   Insect bite - leg swelling -      Medications and allergies reviewed with patient and updated if appropriate.  Current Outpatient Medications on File Prior to Visit  Medication Sig Dispense Refill   Calcium 500-125 MG-UNIT TABS Take by mouth. Take two by mouth daily.     levothyroxine (SYNTHROID) 75 MCG tablet Take 1 tablet (75 mcg) one day a week, 88 mcg tablets the other 6 days of the week-NAME BRAND SYNTHROID     levothyroxine (SYNTHROID) 88 MCG tablet Take 88 mcg by mouth once a week. Once a week     liothyronine (CYTOMEL) 5 MCG tablet Take 2 tablets by mouth daily.     Multiple Vitamin (MULTIVITAMIN) tablet Take 1 tablet by mouth.     Multiple Vitamins-Minerals (ONE DAILY CALCIUM/IRON) TABS Take 1 tablet by mouth daily.     No current facility-administered medications on file prior to visit.    Review of Systems     Objective:  There were no vitals filed for this visit. BP Readings from Last 3 Encounters:  02/13/24 110/74  11/14/23 116/74  01/09/23 118/78   Wt Readings from Last 3 Encounters:  02/13/24 111 lb (50.3 kg)  11/14/23 114 lb (51.7 kg)  01/09/23 110 lb (49.9 kg)   There is no height or weight on file to calculate BMI.    Physical Exam         Assessment & Plan:    See Problem List for Assessment and Plan of chronic medical problems.

## 2024-02-25 ENCOUNTER — Ambulatory Visit: Admitting: Internal Medicine

## 2024-02-25 VITALS — BP 102/78 | HR 91 | Temp 98.2°F | Ht <= 58 in | Wt 113.0 lb

## 2024-02-25 DIAGNOSIS — Z91038 Other insect allergy status: Secondary | ICD-10-CM

## 2024-02-25 DIAGNOSIS — S70361A Insect bite (nonvenomous), right thigh, initial encounter: Secondary | ICD-10-CM | POA: Diagnosis not present

## 2024-02-25 MED ORDER — PREDNISONE 10 MG PO TABS: 10.0000 mg | ORAL_TABLET | Freq: Every day | ORAL | 0 refills | Status: AC

## 2024-02-25 MED ORDER — TRIAMCINOLONE ACETONIDE 0.1 % EX CREA: 1.0000 | TOPICAL_CREAM | Freq: Two times a day (BID) | CUTANEOUS | 0 refills | Status: AC

## 2024-02-25 NOTE — Assessment & Plan Note (Signed)
 Acute Stung yesterday - with areas of erythema, swelling and itching Benadryl helped - some improvement but still significant symptoms Start prednisone  10 mg daily x 3 days ( never had steroids and concerned about possible side effects) Triamcinolone  0.1% cream bid prn

## 2024-02-25 NOTE — Patient Instructions (Addendum)
       Medications changes include :   prednisone  10 mg daily x 3 days, triamcinolone  cream twice daily as needed      Return if symptoms worsen or fail to improve.

## 2024-07-02 ENCOUNTER — Encounter: Payer: Self-pay | Admitting: Internal Medicine

## 2024-07-02 ENCOUNTER — Ambulatory Visit: Admitting: Internal Medicine

## 2024-07-02 VITALS — BP 100/78 | HR 78 | Temp 98.0°F | Ht <= 58 in | Wt 108.0 lb

## 2024-07-02 DIAGNOSIS — N951 Menopausal and female climacteric states: Secondary | ICD-10-CM

## 2024-07-02 DIAGNOSIS — H9201 Otalgia, right ear: Secondary | ICD-10-CM

## 2024-07-02 NOTE — Progress Notes (Signed)
 "   Subjective:    Patient ID: Destiny Douglas, female    DOB: [birth year], 54 y.o.   MRN: [id number]      HPI Destiny Douglas is here for  Chief Complaint  Patient presents with   Ear Pain    Possible piece of q-tip stuck in ear     Discussed the use of AI scribe software for clinical note transcription with the patient, who gave verbal consent to proceed.  History of Present Illness Destiny Douglas is a 54 year old female who presents with discomfort in the ear after a Q-tip incident.  She experienced an incident where she used a Q-tip to clean her ear, and the cotton tip detached, potentially leaving cotton inside her ear. She attempted to rinse her ear with an ear rinse kit, but no cotton was expelled.  She reports a little discomfort in the ear but no significant pain, discharge, or changes in hearing. She is concerned about the possibility of the cotton being lodged in her ear canal.  No ear discharge or changes in hearing. She has never experienced this issue before and is unsure if the cotton was missing from the Q-tip initially or if it came out unnoticed.   She also wonders about hormone replacement   Medications and allergies reviewed with patient and updated if appropriate.  Medications Ordered Prior to Encounter[1]  Review of Systems  HENT:  Negative for ear pain and hearing loss.        Objective:   Vitals:   07/02/24 0808  Pulse: 78  Temp: 98 F (36.7 C)  SpO2: 99%   BP Readings from Last 3 Encounters:  02/25/24 102/78  02/13/24 110/74  11/14/23 116/74   Wt Readings from Last 3 Encounters:  07/02/24 108 lb (49 kg)  02/25/24 113 lb (51.3 kg)  02/13/24 111 lb (50.3 kg)   Body mass index is 22.57 kg/m.    Physical Exam Constitutional:      General: She is not in acute distress.    Appearance: Normal appearance. She is not ill-appearing.  HENT:     Head: Normocephalic and atraumatic.     Right Ear: Tympanic membrane, ear canal and external ear  normal. There is no impacted cerumen.     Left Ear: Tympanic membrane, ear canal and external ear normal. There is no impacted cerumen.  Skin:    General: Skin is warm and dry.  Neurological:     Mental Status: She is alert. Mental status is at baseline.  Psychiatric:        Mood and Affect: Mood normal.        Behavior: Behavior normal.        Thought Content: Thought content normal.        Judgment: Judgment normal.            Assessment & Plan:     Assessment and Plan Assessment & Plan Ear discomfort, Suspected foreign body in ear canal Suspected cotton fragment from Q-tip use. No visible foreign body, redness, wax, discharge, or hearing changes. Discomfort likely from irritation. No eardrum perforation or canal injury. - Instructed to monitor for persistent discomfort or pain. - Reassured no foreign body observed during examination.  Menopausal symptoms, hot flashes Experiencing hot flashes and sleep disturbances since menopause at age 31. Discussed HRT benefits and risks briefly. Non-hormonal options available. Emphasized addressing hot flashes due to potential inflammation and associated risks. - Advised to discuss with her gynecologist for evaluation and  management. - Consider non-hormonal options if HRT is not suitable.       [1]  Current Outpatient Medications on File Prior to Visit  Medication Sig Dispense Refill   Calcium 500-125 MG-UNIT TABS Take by mouth. Take two by mouth daily.     levothyroxine (SYNTHROID) 75 MCG tablet Take 1 tablet (75 mcg) one day a week, 88 mcg tablets the other 6 days of the week-NAME BRAND SYNTHROID     levothyroxine (SYNTHROID) 88 MCG tablet Take 88 mcg by mouth once a week. Once a week     liothyronine (CYTOMEL) 5 MCG tablet Take 2 tablets by mouth daily.     Multiple Vitamin (MULTIVITAMIN) tablet Take 1 tablet by mouth.     Multiple Vitamins-Minerals (ONE DAILY CALCIUM/IRON) TABS Take 1 tablet by mouth daily.     triamcinolone   cream (KENALOG ) 0.1 % Apply 1 Application topically 2 (two) times daily. 30 g 0   No current facility-administered medications on file prior to visit.   "

## 2024-07-02 NOTE — Patient Instructions (Addendum)
" ° ° ° °  Your ear looks good.   "
